# Patient Record
Sex: Male | Born: 1955 | Race: White | Hispanic: No | Marital: Married | Smoking: Current every day smoker
Health system: Southern US, Community
[De-identification: ages and names within clinical notes are randomized; demographics above are authoritative.]

## PROBLEM LIST (undated history)

## (undated) DIAGNOSIS — E7800 Pure hypercholesterolemia, unspecified: Secondary | ICD-10-CM

## (undated) DIAGNOSIS — I00 Rheumatic fever without heart involvement: Secondary | ICD-10-CM

## (undated) DIAGNOSIS — E785 Hyperlipidemia, unspecified: Secondary | ICD-10-CM

## (undated) DIAGNOSIS — T7840XA Allergy, unspecified, initial encounter: Secondary | ICD-10-CM

## (undated) DIAGNOSIS — K219 Gastro-esophageal reflux disease without esophagitis: Secondary | ICD-10-CM

## (undated) DIAGNOSIS — F17200 Nicotine dependence, unspecified, uncomplicated: Secondary | ICD-10-CM

## (undated) DIAGNOSIS — E669 Obesity, unspecified: Secondary | ICD-10-CM

## (undated) DIAGNOSIS — M1990 Unspecified osteoarthritis, unspecified site: Secondary | ICD-10-CM

## (undated) DIAGNOSIS — E119 Type 2 diabetes mellitus without complications: Secondary | ICD-10-CM

## (undated) HISTORY — DX: Rheumatic fever without heart involvement: I00

## (undated) HISTORY — DX: Gastro-esophageal reflux disease without esophagitis: K21.9

## (undated) HISTORY — DX: Pure hypercholesterolemia, unspecified: E78.00

## (undated) HISTORY — DX: Unspecified osteoarthritis, unspecified site: M19.90

## (undated) HISTORY — DX: Hyperlipidemia, unspecified: E78.5

## (undated) HISTORY — DX: Allergy, unspecified, initial encounter: T78.40XA

## (undated) HISTORY — DX: Obesity, unspecified: E66.9

## (undated) HISTORY — DX: Type 2 diabetes mellitus without complications: E11.9

## (undated) HISTORY — DX: Nicotine dependence, unspecified, uncomplicated: F17.200

---

## 1987-08-14 HISTORY — PX: BACK SURGERY: SHX140

## 2007-02-13 ENCOUNTER — Encounter: Payer: Self-pay | Admitting: Internal Medicine

## 2007-02-13 ENCOUNTER — Ambulatory Visit: Payer: Self-pay | Admitting: Internal Medicine

## 2007-02-13 DIAGNOSIS — K219 Gastro-esophageal reflux disease without esophagitis: Secondary | ICD-10-CM

## 2007-02-13 DIAGNOSIS — F17200 Nicotine dependence, unspecified, uncomplicated: Secondary | ICD-10-CM

## 2007-02-13 DIAGNOSIS — E669 Obesity, unspecified: Secondary | ICD-10-CM

## 2007-02-13 HISTORY — DX: Obesity, unspecified: E66.9

## 2007-02-13 HISTORY — DX: Nicotine dependence, unspecified, uncomplicated: F17.200

## 2007-02-13 HISTORY — DX: Gastro-esophageal reflux disease without esophagitis: K21.9

## 2007-02-13 LAB — CONVERTED CEMR LAB
Alkaline Phosphatase: 71 units/L (ref 39–117)
BUN: 12 mg/dL (ref 6–23)
Basophils Relative: 0 % (ref 0.0–1.0)
Bilirubin, Direct: 0.1 mg/dL (ref 0.0–0.3)
CO2: 24 meq/L (ref 19–32)
Cholesterol: 247 mg/dL (ref 0–200)
Direct LDL: 162.1 mg/dL
GFR calc Af Amer: 101 mL/min
Glucose, Bld: 150 mg/dL — ABNORMAL HIGH (ref 70–99)
HCT: 45.7 % (ref 39.0–52.0)
Hemoglobin: 15.4 g/dL (ref 13.0–17.0)
Hgb A1c MFr Bld: 6.5 % — ABNORMAL HIGH (ref 4.6–6.0)
Lymphocytes Relative: 28.4 % (ref 12.0–46.0)
Monocytes Absolute: 0.3 10*3/uL (ref 0.2–0.7)
Monocytes Relative: 5.7 % (ref 3.0–11.0)
Neutro Abs: 2.8 10*3/uL (ref 1.4–7.7)
Neutrophils Relative %: 62.4 % (ref 43.0–77.0)
PSA: 1.88 ng/mL (ref 0.10–4.00)
Potassium: 4.3 meq/L (ref 3.5–5.1)
Sodium: 138 meq/L (ref 135–145)
Total Bilirubin: 0.8 mg/dL (ref 0.3–1.2)
Total Protein: 7.1 g/dL (ref 6.0–8.3)
VLDL: 64 mg/dL — ABNORMAL HIGH (ref 0–40)

## 2007-03-19 ENCOUNTER — Ambulatory Visit: Payer: Self-pay | Admitting: Internal Medicine

## 2007-03-19 DIAGNOSIS — E7800 Pure hypercholesterolemia, unspecified: Secondary | ICD-10-CM

## 2007-03-19 DIAGNOSIS — E119 Type 2 diabetes mellitus without complications: Secondary | ICD-10-CM

## 2007-03-19 HISTORY — DX: Pure hypercholesterolemia, unspecified: E78.00

## 2007-03-19 HISTORY — DX: Type 2 diabetes mellitus without complications: E11.9

## 2010-12-08 ENCOUNTER — Encounter: Payer: Self-pay | Admitting: Internal Medicine

## 2010-12-08 ENCOUNTER — Ambulatory Visit (INDEPENDENT_AMBULATORY_CARE_PROVIDER_SITE_OTHER): Payer: BC Managed Care – PPO | Admitting: Internal Medicine

## 2010-12-08 ENCOUNTER — Ambulatory Visit (INDEPENDENT_AMBULATORY_CARE_PROVIDER_SITE_OTHER)
Admission: RE | Admit: 2010-12-08 | Discharge: 2010-12-08 | Disposition: A | Discharge status: Home / Self Care | Payer: BC Managed Care – PPO | Source: Ambulatory Visit | Attending: Internal Medicine | Admitting: Internal Medicine

## 2010-12-08 DIAGNOSIS — E785 Hyperlipidemia, unspecified: Secondary | ICD-10-CM

## 2010-12-08 DIAGNOSIS — Z87898 Personal history of other specified conditions: Secondary | ICD-10-CM

## 2010-12-08 DIAGNOSIS — E7800 Pure hypercholesterolemia, unspecified: Secondary | ICD-10-CM

## 2010-12-08 DIAGNOSIS — Z8709 Personal history of other diseases of the respiratory system: Secondary | ICD-10-CM

## 2010-12-08 DIAGNOSIS — F17200 Nicotine dependence, unspecified, uncomplicated: Secondary | ICD-10-CM

## 2010-12-08 DIAGNOSIS — G4730 Sleep apnea, unspecified: Secondary | ICD-10-CM

## 2010-12-08 DIAGNOSIS — Z23 Encounter for immunization: Secondary | ICD-10-CM

## 2010-12-08 DIAGNOSIS — Z0000 Encounter for general adult medical examination without abnormal findings: Secondary | ICD-10-CM

## 2010-12-08 DIAGNOSIS — E119 Type 2 diabetes mellitus without complications: Secondary | ICD-10-CM

## 2010-12-08 LAB — BASIC METABOLIC PANEL
BUN: 17 mg/dL (ref 6–23)
CO2: 25 mEq/L (ref 19–32)
Calcium: 8.9 mg/dL (ref 8.4–10.5)
Creatinine, Ser: 1.1 mg/dL (ref 0.4–1.5)
GFR: 74.5 mL/min (ref >60.00)
Glucose, Bld: 105 mg/dL — ABNORMAL HIGH (ref 70–99)

## 2010-12-08 LAB — CBC WITH DIFFERENTIAL/PLATELET
Basophils Relative: 0.4 % (ref 0.0–3.0)
Eosinophils Relative: 3.6 % (ref 0.0–5.0)
Lymphocytes Relative: 28.5 % (ref 12.0–46.0)
Lymphs Abs: 1.6 10*3/uL (ref 0.7–4.0)
MCHC: 34.6 g/dL (ref 30.0–36.0)
Monocytes Absolute: 0.3 10*3/uL (ref 0.1–1.0)
Monocytes Relative: 5.6 % (ref 3.0–12.0)
Platelets: 209 10*3/uL (ref 150.0–400.0)
RDW: 13 % (ref 11.5–14.6)

## 2010-12-08 LAB — HEPATIC FUNCTION PANEL
ALT: 22 U/L (ref 0–53)
Bilirubin, Direct: 0.1 mg/dL (ref 0.0–0.3)
Total Protein: 6.7 g/dL (ref 6.0–8.3)

## 2010-12-08 LAB — PSA: PSA: 1.02 ng/mL (ref 0.10–4.00)

## 2010-12-08 LAB — LIPID PANEL: Triglycerides: 162 mg/dL — ABNORMAL HIGH (ref 0.0–149.0)

## 2010-12-08 NOTE — Progress Notes (Signed)
Addended by: Duard Brady on: 12/08/2010 11:32 AM   Modules accepted: Orders

## 2010-12-08 NOTE — Patient Instructions (Signed)
It is important that you exercise regularly, at least 20 minutes 3 to 4 times per week.  If you develop chest pain or shortness of breath seek  medical attention.  You need to lose weight.  Consider a lower calorie diet and regular exercise.  Return in 6 months for follow-up colonoscopy  Sleep study   Chest X Ray

## 2010-12-08 NOTE — Progress Notes (Signed)
Subjective:    Patient ID: Barry Sims, male    DOB: 03/10/1956, 55 y.o.   MRN: [id number]  HPI  is a 55 year old patient who is seen today for followup and to reassess this with our office. He has a history of impaired glucose tolerance and exogenous obesity. He has dyslipidemia and a history of gastroesophageal reflux disease. He has a family history of coronary artery disease and ongoing tobacco use. 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 He is a heavy snorer but denies any daytime sleepiness clinical exam does reveal a low hanging uvula with significant pharyngeal crowding    Review of Systems  Constitutional: Negative for fever, chills, activity change, appetite change and fatigue.  HENT: Negative for hearing loss, ear pain, congestion, rhinorrhea, sneezing, mouth sores, trouble swallowing, neck pain, neck stiffness, dental problem, voice change, sinus pressure and tinnitus.   Eyes: Negative for photophobia, pain, redness and visual disturbance.  Respiratory: Negative for apnea, cough, choking, chest tightness, shortness of breath and wheezing.   Cardiovascular: Negative for chest pain, palpitations and leg swelling.  Gastrointestinal: Negative for nausea, vomiting, abdominal pain, diarrhea, constipation, blood in stool, abdominal distention, anal bleeding and rectal pain.  Genitourinary: Negative for dysuria, urgency, frequency, hematuria, flank pain, decreased urine volume, discharge, penile swelling, scrotal swelling, difficulty urinating, genital sores and testicular pain.  Musculoskeletal: Negative for myalgias, back pain, joint swelling, arthralgias and gait problem.  Skin: Negative for color change, rash and wound.    Neurological: Negative for dizziness, tremors, seizures, syncope, facial asymmetry, speech difficulty, weakness, light-headedness, numbness and headaches.  Hematological: Negative for adenopathy. Does not bruise/bleed easily.  Psychiatric/Behavioral: Negative for suicidal ideas, hallucinations, behavioral problems, confusion, sleep disturbance, self-injury, dysphoric mood, decreased concentration and agitation. The patient is not nervous/anxious.        Objective:   Physical Exam  Constitutional: He appears well-developed and well-nourished.       Moderate obesity with a weight of 247; blood pressure 120/82  HENT:  Head: Normocephalic and atraumatic.  Right Ear: External ear normal.  Left Ear: External ear normal.  Nose: Nose normal.  Mouth/Throat: Oropharynx is clear and moist.       Low hanging uvula with significant pharyngeal crowding  Eyes: Conjunctivae and EOM are normal. Pupils are equal, round, and reactive to light. No scleral icterus.  Neck: Normal range of motion. Neck supple. No JVD present. No thyromegaly present.  Cardiovascular: Regular rhythm, normal heart sounds and intact distal pulses.  Exam reveals no gallop and no friction rub.   No murmur heard. Pulmonary/Chest: Effort normal and breath sounds normal. He exhibits no tenderness.  Abdominal: Soft. Bowel sounds are normal. He exhibits no distension and no mass. There is no tenderness.  Genitourinary: Prostate normal.  Musculoskeletal: Normal range of motion. He exhibits edema. He exhibits no tenderness.       Trace pedal edema  Lymphadenopathy:    He has no cervical adenopathy.  Neurological: He is alert. He has normal reflexes. No cranial nerve deficit. Coordination normal.  Skin: Skin is warm and dry. No rash noted.  Psychiatric: He has a normal mood and affect. His behavior is normal.          Assessment &  Plan:   Exogenous obesity Ongoing tobacco use Family history coronary artery disease OSA  suspect  History of impaired glucose tolerance history of dyslipidemia  screening colonoscopy Consider sleep study

## 2011-01-26 ENCOUNTER — Telehealth: Payer: Self-pay | Admitting: Internal Medicine

## 2011-01-26 NOTE — Telephone Encounter (Signed)
Will discuss at next office visit

## 2011-01-26 NOTE — Telephone Encounter (Signed)
Attempt to call - number listed with note - no and no mach, called hm# - ans mach -left msg about labs and sleep study. KIK

## 2011-01-26 NOTE — Telephone Encounter (Signed)
I don't see an order for sleep study - it was being considered at time of rov - please advise. kik

## 2011-01-26 NOTE — Telephone Encounter (Signed)
Pt called and said that Dr Amador Cunas had mentioned pt getting a sleep study done back in April. Pt is wondering status of getting an order to have this done? Pt req to get lab results.

## 2011-01-29 ENCOUNTER — Telehealth: Payer: Self-pay | Admitting: *Deleted

## 2011-01-29 NOTE — Telephone Encounter (Signed)
Pt needs form/letter for employer stating that Dr. Kirtland Bouchard did not recommend him to have a sleep study at this time.

## 2011-01-29 NOTE — Telephone Encounter (Signed)
Patient is at risk for OSA and would recommend sleep study at his convenience

## 2011-02-01 ENCOUNTER — Encounter: Payer: Self-pay | Admitting: Gastroenterology

## 2011-02-01 ENCOUNTER — Telehealth: Payer: Self-pay | Admitting: *Deleted

## 2011-02-01 NOTE — Telephone Encounter (Signed)
Attempt to call pt - ans mach - left message about what he really needs - have other phone msg wanting letter for employer - now needing dot form - also dr. Levie Heritage has ok'd sleep study if needed because waiting would be an issus with work. KIK

## 2011-02-01 NOTE — Telephone Encounter (Signed)
Pt calling to check status of DOT form that was to be completed by Dr. Kirtland Bouchard

## 2011-02-01 NOTE — Telephone Encounter (Signed)
Attempt to call at number left and hm# - ans mach - left msg that I need to know what he needs - dot form? Letter for employer? Also dr. Amador Cunas ok sleep study to be order if need because it is cause issus at work. Please call and let me know what is needed. KIK

## 2011-02-08 ENCOUNTER — Telehealth: Payer: Self-pay | Admitting: Internal Medicine

## 2011-02-08 NOTE — Telephone Encounter (Signed)
Pt called to check on status of [employer] form, that the pt brought by office on 02/06/11. 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

## 2011-02-09 NOTE — Telephone Encounter (Signed)
Spoke with pt about form - needs to be filled out as soon as possible. I asked about sleep study - he states he doesn't feel that it is needed at this time , no problems sleeping , and had to plan to  Discuss at next visit as indicated in previous conversations with [place].  Form placed on dr. Vernon Prey desk for completion.  Told pt I will call when form done. KIK

## 2011-02-09 NOTE — Telephone Encounter (Signed)
Spoke with pt - letter ready for pick up KIK

## 2014-03-25 ENCOUNTER — Telehealth: Payer: Self-pay | Admitting: Internal Medicine

## 2014-03-25 NOTE — Telephone Encounter (Signed)
Pt has been sch

## 2014-03-25 NOTE — Telephone Encounter (Signed)
Yes, you can schedule CPX.

## 2014-03-25 NOTE — Telephone Encounter (Signed)
Pt was last seen 12/08/2010. Pt would like to re-est and sch cpx. Can I sch?

## 2014-04-08 ENCOUNTER — Ambulatory Visit (INDEPENDENT_AMBULATORY_CARE_PROVIDER_SITE_OTHER): Payer: BC Managed Care – PPO | Admitting: Internal Medicine

## 2014-04-08 ENCOUNTER — Encounter: Payer: Self-pay | Admitting: Internal Medicine

## 2014-04-08 VITALS — BP 114/74 | HR 73 | Temp 98.9°F | Resp 20 | Ht 68.75 in | Wt 247.0 lb

## 2014-04-08 DIAGNOSIS — E119 Type 2 diabetes mellitus without complications: Secondary | ICD-10-CM

## 2014-04-08 DIAGNOSIS — E669 Obesity, unspecified: Secondary | ICD-10-CM

## 2014-04-08 DIAGNOSIS — Z0000 Encounter for general adult medical examination without abnormal findings: Secondary | ICD-10-CM

## 2014-04-08 DIAGNOSIS — E7800 Pure hypercholesterolemia, unspecified: Secondary | ICD-10-CM

## 2014-04-08 DIAGNOSIS — F17200 Nicotine dependence, unspecified, uncomplicated: Secondary | ICD-10-CM

## 2014-04-08 LAB — HEMOGLOBIN A1C: Hgb A1c MFr Bld: 7.4 % — ABNORMAL HIGH (ref 4.6–6.5)

## 2014-04-08 LAB — PSA: PSA: 0.85 ng/mL (ref 0.10–4.00)

## 2014-04-08 NOTE — Progress Notes (Signed)
Subjective:    Patient ID: Barry Sims, male    DOB: [birth year], 58 y.o.   MRN: [id number]  HPI  58 year old patient who is seen today for an annual exam.  He has not been seen here in about 3 years.. He has a history of impaired glucose tolerance and exogenous obesity. He has dyslipidemia and a history of gastroesophageal reflux disease. He has a family history of coronary artery disease and ongoing tobacco use. He is asymptomatic today.  He states that he has had a recent laboratory panel at work and a random blood sugar was 160.  Hemoglobin A1c is to have been in a nondiabetic range, but have not been checked in about 3 years.  He also states that he was told he had an elevated PSA of 10 in the past.  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 He is a heavy snorer but denies any daytime sleepiness clinical exam does reveal a low hanging uvula with significant pharyngeal crowding.  A sleep study was set up in the past, but never performed.  He denies any daytime sleepiness at present, and states he drives very little.  Past Medical History:  patient had back surgery in 1989 for herniated disk. Otherwise no hospital admissions   Family History:  Father died secondary to liver cancer. Mother died at 31 of single dementia of the Alzheimer's type. Two brothers, one with the CAD disease, status post multiple stenting one sister with COPD  Risk Factors:  Tobacco use: current  Counseled to quit/cut down tobacco use: yes    Past Medical History  Diagnosis Date  . Allergy   . Arthritis   . GERD (gastroesophageal reflux disease)   . Rheumatic fever   . Hyperlipidemia   . Diabetes mellitus   . Obesity   . TOBACCO  USE 02/13/2007  . OBESITY 02/13/2007  . HYPERCHOLESTEROLEMIA 03/19/2007  . G E R D 02/13/2007  . AODM 03/19/2007    History   Social History  . Marital Status: Married    Spouse Name: N/A    Number of Children: N/A  . Years of Education: N/A   Occupational History  . Not on file.   Social History Main Topics  . Smoking status: Current Every Day Smoker -- 1.00 packs/day    Types: Cigarettes  . Smokeless tobacco: Never Used  . Alcohol Use: No  . Drug Use: No  . Sexual Activity: Not on file   Other Topics Concern  . Not on file   Social History Narrative  . No narrative on file    Past Surgical History  Procedure Laterality Date  . Back surgery  1989    Family History  Problem Relation Age of Onset  . Cancer Father   . COPD Sister   . Heart disease Brother   . Prostate cancer Brother     Allergies  Allergen Reactions  . Penicillins     No current outpatient prescriptions on file prior to visit.   No current facility-administered medications on file prior to visit.    BP 114/74  Pulse 73  Temp(Src) 98.9 F (37.2 C) (Oral)  Resp 20  Ht 5' 8.75" (  1.746 m)  Wt 247 lb (112.038 kg)  BMI 36.75 kg/m2  SpO2 98%       Review of Systems  Constitutional: Negative for fever, chills, activity change, appetite change and fatigue.  HENT: Negative for congestion, dental problem, ear pain, hearing loss, mouth sores, rhinorrhea, sinus pressure, sneezing, tinnitus, trouble swallowing and voice change.   Eyes: Negative for photophobia, pain, redness and visual disturbance.  Respiratory: Negative for apnea, cough, choking, chest tightness, shortness of breath and wheezing.   Cardiovascular: Negative for chest pain, palpitations and leg swelling.  Gastrointestinal: Negative for nausea, vomiting, abdominal pain, diarrhea, constipation, blood in stool, abdominal distention, anal bleeding and rectal pain.  Genitourinary: Negative for dysuria, urgency, frequency, hematuria,  flank pain, decreased urine volume, discharge, penile swelling, scrotal swelling, difficulty urinating, genital sores and testicular pain.  Musculoskeletal: Negative for arthralgias, back pain, gait problem, joint swelling, myalgias, neck pain and neck stiffness.  Skin: Negative for color change, rash and wound.  Neurological: Negative for dizziness, tremors, seizures, syncope, facial asymmetry, speech difficulty, weakness, light-headedness, numbness and headaches.  Hematological: Negative for adenopathy. Does not bruise/bleed easily.  Psychiatric/Behavioral: Negative for suicidal ideas, hallucinations, behavioral problems, confusion, sleep disturbance, self-injury, dysphoric mood, decreased concentration and agitation. The patient is not nervous/anxious.        Objective:   Physical Exam  Constitutional: He appears well-developed and well-nourished.  Moderate obesity with a weight of 247; blood pressure 120/82  HENT:  Head: Normocephalic and atraumatic.  Right Ear: External ear normal.  Left Ear: External ear normal.  Nose: Nose normal.  Mouth/Throat: Oropharynx is clear and moist.  Low hanging uvula with significant pharyngeal crowding  Eyes: Conjunctivae and EOM are normal. Pupils are equal, round, and reactive to light. No scleral icterus.  Strabismus noted  Neck: Normal range of motion. Neck supple. No JVD present. No thyromegaly present.  Cardiovascular: Regular rhythm, normal heart sounds and intact distal pulses.  Exam reveals no gallop and no friction rub.   No murmur heard. Pulmonary/Chest: Effort normal and breath sounds normal. He exhibits no tenderness.  Abdominal: Soft. Bowel sounds are normal. He exhibits no distension and no mass. There is no tenderness.  Genitourinary: Prostate normal. Guaiac negative stool.  Musculoskeletal: Normal range of motion. He exhibits edema. He exhibits no tenderness.  Trace pedal edema  Lymphadenopathy:    He has no cervical adenopathy.   Neurological: He is alert. He has normal reflexes. No cranial nerve deficit. Coordination normal.  Skin: Skin is warm and dry. No rash noted.  Psychiatric: He has a normal mood and affect. His behavior is normal.          Assessment & Plan:   Exogenous obesity Ongoing tobacco use Family history coronary artery disease OSA suspect  History of impaired glucose tolerance.  We'll check a hemoglobin A1c history of dyslipidemia History of elevated PSA.  We'll recheck  screening colonoscopy Consider sleep study

## 2014-04-08 NOTE — Progress Notes (Signed)
Pre visit review using our clinic review tool, if applicable. No additional management support is needed unless otherwise documented below in the visit note. 

## 2014-04-08 NOTE — Progress Notes (Signed)
   Subjective:    Patient ID: Barry Sims, male    DOB: April 08, 1956, 58 y.o.   MRN: [id number]  HPI  Wt Readings from Last 3 Encounters:  04/08/14 247 lb (112.038 kg)  12/08/10 247 lb (112.038 kg)  03/19/07 270 lb (122.471 kg)    Review of Systems     Objective:   Physical Exam        Assessment & Plan:

## 2014-04-08 NOTE — Patient Instructions (Signed)
Limit your sodium (Salt) intake    It is important that you exercise regularly, at least 20 minutes 3 to 4 times per week.  If you develop chest pain or shortness of breath seek  medical attention.  You need to lose weight.  Consider a lower calorie diet and regular exercise.Health Maintenance A healthy lifestyle and preventative care can promote health and wellness.  Maintain regular health, dental, and eye exams.  Eat a healthy diet. Foods like vegetables, fruits, whole grains, low-fat dairy products, and lean protein foods contain the nutrients you need and are low in calories. Decrease your intake of foods high in solid fats, added sugars, and salt. Get information about a proper diet from your health care provider, if necessary.  Regular physical exercise is one of the most important things you can do for your health. Most adults should get at least 150 minutes of moderate-intensity exercise (any activity that increases your heart rate and causes you to sweat) each week. In addition, most adults need muscle-strengthening exercises on 2 or more days a week.   Maintain a healthy weight. The body mass index (BMI) is a screening tool to identify possible weight problems. It provides an estimate of body fat based on height and weight. Your health care provider can find your BMI and can help you achieve or maintain a healthy weight. For males 20 years and older:  A BMI below 18.5 is considered underweight.  A BMI of 18.5 to 24.9 is normal.  A BMI of 25 to 29.9 is considered overweight.  A BMI of 30 and above is considered obese.  Maintain normal blood lipids and cholesterol by exercising and minimizing your intake of saturated fat. Eat a balanced diet with plenty of fruits and vegetables. Blood tests for lipids and cholesterol should begin at age 20 and be repeated every 5 years. If your lipid or cholesterol levels are high, you are over age 50, or you are at high risk for heart disease, you  may need your cholesterol levels checked more frequently.Ongoing high lipid and cholesterol levels should be treated with medicines if diet and exercise are not working.  If you smoke, find out from your health care provider how to quit. If you do not use tobacco, do not start.  Lung cancer screening is recommended for adults aged 55-80 years who are at high risk for developing lung cancer because of a history of smoking. A yearly low-dose CT scan of the lungs is recommended for people who have at least a 30-pack-year history of smoking and are current smokers or have quit within the past 15 years. A pack year of smoking is smoking an average of 1 pack of cigarettes a day for 1 year (for example, a 30-pack-year history of smoking could mean smoking 1 pack a day for 30 years or 2 packs a day for 15 years). Yearly screening should continue until the smoker has stopped smoking for at least 15 years. Yearly screening should be stopped for people who develop a health problem that would prevent them from having lung cancer treatment.  If you choose to drink alcohol, do not have more than 2 drinks per day. One drink is considered to be 12 oz (360 mL) of beer, 5 oz (150 mL) of wine, or 1.5 oz (45 mL) of liquor.  Avoid the use of street drugs. Do not share needles with anyone. Ask for help if you need support or instructions about stopping the use of drugs.    High blood pressure causes heart disease and increases the risk of stroke. Blood pressure should be checked at least every 1-2 years. Ongoing high blood pressure should be treated with medicines if weight loss and exercise are not effective.  If you are 45-79 years old, ask your health care provider if you should take aspirin to prevent heart disease.  Diabetes screening involves taking a blood sample to check your fasting blood sugar level. This should be done once every 3 years after age 45 if you are at a normal weight and without risk factors for  diabetes. Testing should be considered at a younger age or be carried out more frequently if you are overweight and have at least 1 risk factor for diabetes.  Colorectal cancer can be detected and often prevented. Most routine colorectal cancer screening begins at the age of 50 and continues through age 75. However, your health care provider may recommend screening at an earlier age if you have risk factors for colon cancer. On a yearly basis, your health care provider may provide home test kits to check for hidden blood in the stool. A small camera at the end of a tube may be used to directly examine the colon (sigmoidoscopy or colonoscopy) to detect the earliest forms of colorectal cancer. Talk to your health care provider about this at age 50 when routine screening begins. A direct exam of the colon should be repeated every 5-10 years through age 75, unless early forms of precancerous polyps or small growths are found.  People who are at an increased risk for hepatitis B should be screened for this virus. You are considered at high risk for hepatitis B if:  You were born in a country where hepatitis B occurs often. Talk with your health care provider about which countries are considered high risk.  Your parents were born in a high-risk country and you have not received a shot to protect against hepatitis B (hepatitis B vaccine).  You have HIV or AIDS.  You use needles to inject street drugs.  You live with, or have sex with, someone who has hepatitis B.  You are a man who has sex with other men (MSM).  You get hemodialysis treatment.  You take certain medicines for conditions like cancer, organ transplantation, and autoimmune conditions.  Hepatitis C blood testing is recommended for all people born from 1945 through 1965 and any individual with known risk factors for hepatitis C.  Healthy men should no longer receive prostate-specific antigen (PSA) blood tests as part of routine cancer  screening. Talk to your health care provider about prostate cancer screening.  Testicular cancer screening is not recommended for adolescents or adult males who have no symptoms. Screening includes self-exam, a health care provider exam, and other screening tests. Consult with your health care provider about any symptoms you have or any concerns you have about testicular cancer.  Practice safe sex. Use condoms and avoid high-risk sexual practices to reduce the spread of sexually transmitted infections (STIs).  You should be screened for STIs, including gonorrhea and chlamydia if:  You are sexually active and are younger than 24 years.  You are older than 24 years, and your health care provider tells you that you are at risk for this type of infection.  Your sexual activity has changed since you were last screened, and you are at an increased risk for chlamydia or gonorrhea. Ask your health care provider if you are at risk.  If you are   at risk of being infected with HIV, it is recommended that you take a prescription medicine daily to prevent HIV infection. This is called pre-exposure prophylaxis (PrEP). You are considered at risk if:  You are a man who has sex with other men (MSM).  You are a heterosexual man who is sexually active with multiple partners.  You take drugs by injection.  You are sexually active with a partner who has HIV.  Talk with your health care provider about whether you are at high risk of being infected with HIV. If you choose to begin PrEP, you should first be tested for HIV. You should then be tested every 3 months for as long as you are taking PrEP.  Use sunscreen. Apply sunscreen liberally and repeatedly throughout the day. You should seek shade when your shadow is shorter than you. Protect yourself by wearing long sleeves, pants, a wide-brimmed hat, and sunglasses year round whenever you are outdoors.  Tell your health care provider of new moles or changes in  moles, especially if there is a change in shape or color. Also, tell your health care provider if a mole is larger than the size of a pencil eraser.  A one-time screening for abdominal aortic aneurysm (AAA) and surgical repair of large AAAs by ultrasound is recommended for men aged 65-75 years who are current or former smokers.  Stay current with your vaccines (immunizations). Document Released: 01/26/2008 Document Revised: 08/04/2013 Document Reviewed: 12/25/2010 [employer] Patient Information 2015 [employer]. This information is not intended to replace advice given to you by your health care provider. Make sure you discuss any questions you have with your health care provider. Cardiac Diet This diet can help prevent heart disease and stroke. Many factors influence your heart health, including eating and exercise habits. Coronary risk rises a lot with abnormal blood fat (lipid) levels. Cardiac meal planning includes limiting unhealthy fats, increasing healthy fats, and making other small dietary changes. General guidelines are as follows:  Adjust calorie intake to reach and maintain desirable body weight.  Limit total fat intake to less than 30% of total calories. Saturated fat should be less than 7% of calories.  Saturated fats are found in animal products and in some vegetable products. Saturated vegetable fats are found in coconut oil, cocoa butter, palm oil, and palm kernel oil. Read labels carefully to avoid these products as much as possible. Use butter in moderation. Choose tub margarines and oils that have 2 grams of fat or less. Good cooking oils are canola and olive oils.  Practice low-fat cooking techniques. Do not fry food. Instead, broil, bake, boil, steam, grill, roast on a rack, stir-fry, or microwave it. Other fat reducing suggestions include:  Remove the skin from poultry.  Remove all visible fat from meats.  Skim the fat off stews, soups, and gravies before serving  them.  Steam vegetables in water or broth instead of sauting them in fat.  Avoid foods with trans fat (or hydrogenated oils), such as commercially fried foods and commercially baked goods. Commercial shortening and deep-frying fats will contain trans fat.  Increase intake of fruits, vegetables, whole grains, and legumes to replace foods high in fat.  Increase consumption of nuts, legumes, and seeds to at least 4 servings weekly. One serving of a legume equals  cup, and 1 serving of nuts or seeds equals  cup.  Choose whole grains more often. Have 3 servings per day (a serving is 1 ounce [oz]).  Eat 4 to   5 servings of vegetables per day. A serving of vegetables is 1 cup of raw leafy vegetables;  cup of raw or cooked cut-up vegetables;  cup of vegetable juice.  Eat 4 to 5 servings of fruit per day. A serving of fruit is 1 medium whole fruit;  cup of dried fruit;  cup of fresh, frozen, or canned fruit;  cup of 100% fruit juice.  Increase your intake of dietary fiber to 20 to 30 grams per day. Insoluble fiber may help lower your risk of heart disease and may help curb your appetite.  Soluble fiber binds cholesterol to be removed from the blood. Foods high in soluble fiber are dried beans, citrus fruits, oats, apples, bananas, broccoli, Brussels sprouts, and eggplant.  Try to include foods fortified with plant sterols or stanols, such as yogurt, breads, juices, or margarines. Choose several fortified foods to achieve a daily intake of 2 to 3 grams of plant sterols or stanols.  Foods with omega-3 fats can help reduce your risk of heart disease. Aim to have a 3.5 oz portion of fatty fish twice per week, such as salmon, mackerel, albacore tuna, sardines, lake trout, or herring. If you wish to take a fish oil supplement, choose one that contains 1 gram of both DHA and EPA.  Limit processed meats to 2 servings (3 oz portion) weekly.  Limit the sodium in your diet to 1500 milligrams (mg) per  day. If you have high blood pressure, talk to a registered dietitian about a DASH (Dietary Approaches to Stop Hypertension) eating plan.  Limit sweets and beverages with added sugar, such as soda, to no more than 5 servings per week. One serving is:   1 tablespoon sugar.  1 tablespoon jelly or jam.   cup sorbet.  1 cup lemonade.   cup regular soda. CHOOSING FOODS Starches  Allowed: Breads: All kinds (wheat, rye, raisin, white, oatmeal, Italian, French, and English muffin bread). Low-fat rolls: English muffins, frankfurter and hamburger buns, bagels, pita bread, tortillas (not fried). Pancakes, waffles, biscuits, and muffins made with recommended oil.  Avoid: Products made with saturated or trans fats, oils, or whole milk products. Butter rolls, cheese breads, croissants. Commercial doughnuts, muffins, sweet rolls, biscuits, waffles, pancakes, store-bought mixes. Crackers  Allowed: Low-fat crackers and snacks: Animal, graham, rye, saltine (with recommended oil, no lard), oyster, and matzo crackers. Bread sticks, melba toast, rusks, flatbread, pretzels, and light popcorn.  Avoid: High-fat crackers: cheese crackers, butter crackers, and those made with coconut, palm oil, or trans fat (hydrogenated oils). Buttered popcorn. Cereals  Allowed: Hot or cold whole-grain cereals.  Avoid: Cereals containing coconut, hydrogenated vegetable fat, or animal fat. Potatoes / Pasta / Rice  Allowed: All kinds of potatoes, rice, and pasta (such as macaroni, spaghetti, and noodles).  Avoid: Pasta or rice prepared with cream sauce or high-fat cheese. Chow mein noodles, French fries. Vegetables  Allowed: All vegetables and vegetable juices.  Avoid: Fried vegetables. Vegetables in cream, butter, or high-fat cheese sauces. Limit coconut. Fruit in cream or custard. Protein  Allowed: Limit your intake of meat, seafood, and poultry to no more than 6 oz (cooked weight) per day. All lean, well-trimmed  beef, veal, pork, and lamb. All chicken and turkey without skin. All fish and shellfish. Wild game: wild duck, rabbit, pheasant, and venison. Egg whites or low-cholesterol egg substitutes may be used as desired. Meatless dishes: recipes with dried beans, peas, lentils, and tofu (soybean curd). Seeds and nuts: all seeds and most nuts.  Avoid:   Prime grade and other heavily marbled and fatty meats, such as short ribs, spare ribs, rib eye roast or steak, frankfurters, sausage, bacon, and high-fat luncheon meats, mutton. Caviar. Commercially fried fish. Domestic duck, goose, venison sausage. Organ meats: liver, gizzard, heart, chitterlings, brains, kidney, sweetbreads. Dairy  Allowed: Low-fat cheeses: nonfat or low-fat cottage cheese (1% or 2% fat), cheeses made with part skim milk, such as mozzarella, farmers, string, or ricotta. (Cheeses should be labeled no more than 2 to 6 grams fat per oz.). Skim (or 1%) milk: liquid, powdered, or evaporated. Buttermilk made with low-fat milk. Drinks made with skim or low-fat milk or cocoa. Chocolate milk or cocoa made with skim or low-fat (1%) milk. Nonfat or low-fat yogurt.  Avoid: Whole milk cheeses, including colby, cheddar, muenster, Monterey Jack, Havarti, Brie, Camembert, American, Swiss, and blue. Creamed cottage cheese, cream cheese. Whole milk and whole milk products, including buttermilk or yogurt made from whole milk, drinks made from whole milk. Condensed milk, evaporated whole milk, and 2% milk. Soups and Combination Foods  Allowed: Low-fat low-sodium soups: broth, dehydrated soups, homemade broth, soups with the fat removed, homemade cream soups made with skim or low-fat milk. Low-fat spaghetti, lasagna, chili, and Spanish rice if low-fat ingredients and low-fat cooking techniques are used.  Avoid: Cream soups made with whole milk, cream, or high-fat cheese. All other soups. Desserts and Sweets  Allowed: Sherbet, fruit ices, gelatins, meringues, and  angel food cake. Homemade desserts with recommended fats, oils, and milk products. Jam, jelly, honey, marmalade, sugars, and syrups. Pure sugar candy, such as gum drops, hard candy, jelly beans, marshmallows, mints, and small amounts of dark chocolate.  Avoid: Commercially prepared cakes, pies, cookies, frosting, pudding, or mixes for these products. Desserts containing whole milk products, chocolate, coconut, lard, palm oil, or palm kernel oil. Ice cream or ice cream drinks. Candy that contains chocolate, coconut, butter, hydrogenated fat, or unknown ingredients. Buttered syrups. Fats and Oils  Allowed: Vegetable oils: safflower, sunflower, corn, soybean, cottonseed, sesame, canola, olive, or peanut. Non-hydrogenated margarines. Salad dressing or mayonnaise: homemade or commercial, made with a recommended oil. Low or nonfat salad dressing or mayonnaise.  Limit added fats and oils to 6 to 8 tsp per day (includes fats used in cooking, baking, salads, and spreads on bread). Remember to count the "hidden fats" in foods.  Avoid: Solid fats and shortenings: butter, lard, salt pork, bacon drippings. Gravy containing meat fat, shortening, or suet. Cocoa butter, coconut. Coconut oil, palm oil, palm kernel oil, or hydrogenated oils: these ingredients are often used in bakery products, nondairy creamers, whipped toppings, candy, and commercially fried foods. Read labels carefully. Salad dressings made of unknown oils, sour cream, or cheese, such as blue cheese and Roquefort. Cream, all kinds: half-and-half, light, heavy, or whipping. Sour cream or cream cheese (even if "light" or low-fat). Nondairy cream substitutes: coffee creamers and sour cream substitutes made with palm, palm kernel, hydrogenated oils, or coconut oil. Beverages  Allowed: Coffee (regular or decaffeinated), tea. Diet carbonated beverages, mineral water. Alcohol: Check with your caregiver. Moderation is recommended.  Avoid: Whole milk, regular  sodas, and juice drinks with added sugar. Condiments  Allowed: All seasonings and condiments. Cocoa powder. "Cream" sauces made with recommended ingredients.  Avoid: Carob powder made with hydrogenated fats. SAMPLE MENU Breakfast   cup orange juice   cup oatmeal  1 slice toast  1 tsp margarine  1 cup skim milk Lunch  Turkey sandwich with 2 oz turkey, 2 slices bread  Lettuce   and tomato slices  Fresh fruit  Carrot sticks  Coffee or tea Snack  Fresh fruit or low-fat crackers Dinner  3 oz lean ground beef  1 baked potato  1 tsp margarine   cup asparagus  Lettuce salad  1 tbs non-creamy dressing   cup peach slices  1 cup skim milk Document Released: 05/08/2008 Document Revised: 01/29/2012 Document Reviewed: 09/29/2013 [employer] Patient Information 2015 [employer]. This information is not intended to replace advice given to you by your health care provider. Make sure you discuss any questions you have with your health care provider.  

## 2014-04-09 ENCOUNTER — Other Ambulatory Visit: Payer: Self-pay | Admitting: *Deleted

## 2014-04-09 MED ORDER — METFORMIN HCL 500 MG PO TABS: 500.0000 mg | ORAL_TABLET | Freq: Two times a day (BID) | ORAL | Status: DC

## 2014-05-06 ENCOUNTER — Encounter: Payer: BC Managed Care – PPO | Admitting: Internal Medicine

## 2014-05-14 ENCOUNTER — Encounter: Payer: Self-pay | Admitting: Internal Medicine

## 2014-10-03 ENCOUNTER — Other Ambulatory Visit: Payer: Self-pay | Admitting: Internal Medicine

## 2014-11-18 ENCOUNTER — Telehealth: Payer: Self-pay | Admitting: Internal Medicine

## 2014-11-18 ENCOUNTER — Other Ambulatory Visit: Payer: Self-pay | Admitting: Family Medicine

## 2014-11-18 DIAGNOSIS — E119 Type 2 diabetes mellitus without complications: Secondary | ICD-10-CM

## 2014-11-18 NOTE — Telephone Encounter (Signed)
Yes, please schedule lab appt and remind pt he is due for physical in August. Will put order in EP[facility]. Dr.Hunter said okay to order Hemoglobin A1c. Order in EPIC.

## 2014-11-18 NOTE — Telephone Encounter (Signed)
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 Can this be done

## 2014-11-19 ENCOUNTER — Other Ambulatory Visit (INDEPENDENT_AMBULATORY_CARE_PROVIDER_SITE_OTHER): Payer: Self-pay

## 2014-11-19 DIAGNOSIS — E119 Type 2 diabetes mellitus without complications: Secondary | ICD-10-CM

## 2014-11-19 LAB — HEMOGLOBIN A1C: HEMOGLOBIN A1C: 6 % (ref 4.6–6.5)

## 2014-11-19 NOTE — Telephone Encounter (Signed)
lmovm to c/b and schedule lab for A1C

## 2014-11-19 NOTE — Telephone Encounter (Signed)
Pt has been sch for today at 315pm

## 2015-04-12 ENCOUNTER — Other Ambulatory Visit: Payer: Self-pay | Admitting: Internal Medicine

## 2015-04-29 ENCOUNTER — Ambulatory Visit
Admission: RE | Admit: 2015-04-29 | Discharge: 2015-04-29 | Disposition: A | Discharge status: Home / Self Care | Payer: Worker's Compensation | Source: Ambulatory Visit | Attending: Family Medicine | Admitting: Family Medicine

## 2015-04-29 ENCOUNTER — Other Ambulatory Visit: Payer: Self-pay | Admitting: Family Medicine

## 2015-04-29 DIAGNOSIS — T1490XA Injury, unspecified, initial encounter: Secondary | ICD-10-CM

## 2015-04-29 DIAGNOSIS — M5489 Other dorsalgia: Secondary | ICD-10-CM

## 2016-04-15 ENCOUNTER — Other Ambulatory Visit: Payer: Self-pay | Admitting: Internal Medicine

## 2016-04-19 ENCOUNTER — Other Ambulatory Visit: Payer: Self-pay | Admitting: Internal Medicine

## 2017-03-03 ENCOUNTER — Encounter (HOSPITAL_BASED_OUTPATIENT_CLINIC_OR_DEPARTMENT_OTHER): Payer: Self-pay | Admitting: Emergency Medicine

## 2017-03-03 ENCOUNTER — Emergency Department (HOSPITAL_BASED_OUTPATIENT_CLINIC_OR_DEPARTMENT_OTHER): Payer: BLUE CROSS/BLUE SHIELD

## 2017-03-03 ENCOUNTER — Emergency Department (HOSPITAL_BASED_OUTPATIENT_CLINIC_OR_DEPARTMENT_OTHER)
Admission: EM | Admit: 2017-03-03 | Discharge: 2017-03-03 | Disposition: A | Discharge status: Home / Self Care | Payer: BLUE CROSS/BLUE SHIELD | Attending: Emergency Medicine | Admitting: Emergency Medicine

## 2017-03-03 DIAGNOSIS — E119 Type 2 diabetes mellitus without complications: Secondary | ICD-10-CM | POA: Insufficient documentation

## 2017-03-03 DIAGNOSIS — F17210 Nicotine dependence, cigarettes, uncomplicated: Secondary | ICD-10-CM | POA: Insufficient documentation

## 2017-03-03 DIAGNOSIS — R1031 Right lower quadrant pain: Secondary | ICD-10-CM | POA: Diagnosis not present

## 2017-03-03 DIAGNOSIS — Z7984 Long term (current) use of oral hypoglycemic drugs: Secondary | ICD-10-CM | POA: Insufficient documentation

## 2017-03-03 LAB — CBC WITH DIFFERENTIAL/PLATELET
BASOS PCT: 0 %
Basophils Absolute: 0 10*3/uL (ref 0.0–0.1)
EOS ABS: 0.2 10*3/uL (ref 0.0–0.7)
EOS PCT: 3 %
HCT: 41.1 % (ref 39.0–52.0)
HEMOGLOBIN: 14.4 g/dL (ref 13.0–17.0)
Lymphocytes Relative: 20 %
Lymphs Abs: 1.3 10*3/uL (ref 0.7–4.0)
MCH: 32.6 pg (ref 26.0–34.0)
MCHC: 35 g/dL (ref 30.0–36.0)
MCV: 93 fL (ref 78.0–100.0)
Monocytes Absolute: 0.4 10*3/uL (ref 0.1–1.0)
Monocytes Relative: 7 %
NEUTROS PCT: 70 %
Neutro Abs: 4.3 10*3/uL (ref 1.7–7.7)
PLATELETS: 180 10*3/uL (ref 150–400)
RBC: 4.42 MIL/uL (ref 4.22–5.81)
RDW: 13 % (ref 11.5–15.5)
WBC: 6.2 10*3/uL (ref 4.0–10.5)

## 2017-03-03 LAB — COMPREHENSIVE METABOLIC PANEL
ALBUMIN: 3.8 g/dL (ref 3.5–5.0)
ALT: 15 U/L — AB (ref 17–63)
ANION GAP: 7 (ref 5–15)
AST: 18 U/L (ref 15–41)
Alkaline Phosphatase: 62 U/L (ref 38–126)
BUN: 13 mg/dL (ref 6–20)
CALCIUM: 8.7 mg/dL — AB (ref 8.9–10.3)
CHLORIDE: 107 mmol/L (ref 101–111)
CO2: 26 mmol/L (ref 22–32)
Creatinine, Ser: 1.2 mg/dL (ref 0.61–1.24)
GFR calc Af Amer: >60 mL/min (ref >60)
GFR calc non Af Amer: >60 mL/min (ref >60)
GLUCOSE: 114 mg/dL — AB (ref 65–99)
Potassium: 4 mmol/L (ref 3.5–5.1)
SODIUM: 140 mmol/L (ref 135–145)
Total Bilirubin: 0.7 mg/dL (ref 0.3–1.2)
Total Protein: 6.8 g/dL (ref 6.5–8.1)

## 2017-03-03 LAB — URINALYSIS, ROUTINE W REFLEX MICROSCOPIC
Bilirubin Urine: NEGATIVE
GLUCOSE, UA: NEGATIVE mg/dL
Hgb urine dipstick: NEGATIVE
Ketones, ur: NEGATIVE mg/dL
Leukocytes, UA: NEGATIVE
NITRITE: NEGATIVE
Protein, ur: NEGATIVE mg/dL
SPECIFIC GRAVITY, URINE: 1.019 (ref 1.005–1.030)
pH: 7.5 (ref 5.0–8.0)

## 2017-03-03 MED ORDER — SODIUM CHLORIDE 0.9 % IV BOLUS (SEPSIS)
500.0000 mL | Freq: Once | INTRAVENOUS | Status: AC
  Administered 2017-03-03: 500 mL via INTRAVENOUS

## 2017-03-03 MED ORDER — IOPAMIDOL (ISOVUE-300) INJECTION 61%
100.0000 mL | Freq: Once | INTRAVENOUS | Status: AC | PRN
  Administered 2017-03-03: 100 mL via INTRAVENOUS

## 2017-03-03 NOTE — ED Triage Notes (Signed)
RLQ abd pain x 3 days, denies N/V/D, denies urinary symptoms.

## 2017-03-03 NOTE — ED Provider Notes (Signed)
[facility] Provider Note   CSN: [id number] Arrival date & time: 03/03/17  1656     History   Chief Complaint Chief Complaint  Patient presents with  . Abdominal Pain    HPI Barry Sims is a 61 y.o. male.  HPI Patient presents with right lower quadrant abdominal pain. His had the last 3 days. As dull. May have slight urinary difficulty. No fevers. No nausea vomiting or diarrhea. No change in his bowel movements. Has not had pains like this before. The bumps it hurt him on the way here. Appetite is been mildly decreased. States that his wife made him to come into the hospital today. Past Medical History:  Diagnosis Date  . Allergy   . 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 Arthritis   . Diabetes mellitus   . G E R D 02/13/2007  . GERD (gastroesophageal reflux disease)   . HYPERCHOLESTEROLEMIA 03/19/2007  . Hyperlipidemia   . Obesity   . OBESITY 02/13/2007  . Rheumatic fever   . TOBACCO USE 02/13/2007    Patient Active Problem List   Diagnosis Date Noted  . 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 HYPERCHOLESTEROLEMIA 03/19/2007  . OBESITY 02/13/2007  . TOBACCO USE 02/13/2007  . G E R D 02/13/2007    Past Surgical History:  Procedure Laterality Date  . BACK SURGERY  1989       Home Medications    Prior to Admission medications   Medication Sig Start Date End Date Taking? Authorizing Provider  ibuprofen (ADVIL) 200 MG tablet Take 400 mg by mouth every 6 (six) hours as needed.    [provider]  metFORMIN (GLUCOPHAGE) 500 MG tablet TAKE ONE TABLET BY MOUTH TWICE DAILY WITH MEALS 04/12/15   Gordy SaversKwiatkowski, Peter F, MD    Family History Family History  Problem Relation Age of Onset  . Cancer Father   . COPD Sister   . Heart disease Brother   . Prostate cancer Brother     Social History Social History  Substance Use Topics  . Smoking status: Current Every Day Smoker    Packs/day: 1.00    Types: Cigarettes  . Smokeless tobacco: Never Used  . Alcohol use No      Allergies   Penicillins   Review of Systems Review of Systems  Constitutional: Positive for appetite change. Negative for fever.  HENT: Negative for congestion.   Gastrointestinal: Positive for abdominal pain. Negative for constipation, diarrhea, nausea and vomiting.  Endocrine: Negative for polyuria.  Genitourinary: Negative for frequency.  Musculoskeletal: Negative for back pain.  Skin: Negative for rash.  Neurological: Negative for seizures.  Hematological: Negative for adenopathy.  Psychiatric/Behavioral: Negative for confusion.     Physical Exam Updated Vital Signs BP 120/75 (BP Location: Left Arm)   Pulse (!) 59   Temp 98.8 F (37.1 C) (Oral)   Resp 18   Ht 5\' 10"  (1.778 m)   Wt 95.3 kg (210 lb)   SpO2 98%   BMI 30.13 kg/m   Physical Exam  Constitutional: He appears well-developed.  HENT:  Head: Atraumatic.  Eyes: Pupils are equal, round, and reactive to light.  Neck: Neck supple.  Cardiovascular: Normal rate.   Pulmonary/Chest: Effort normal.  Abdominal: Soft. There is tenderness.  Right lower quadrant tenderness without rebound or guarding. No perineal tenderness.  Musculoskeletal: Normal range of motion.  Neurological: He is alert.  Skin: Skin is warm. Capillary refill takes less than 2 seconds.  Psychiatric: He has a normal mood and affect.  ED Treatments / Results  Labs (all labs ordered are listed, but only abnormal results are displayed) Labs Reviewed  URINALYSIS, ROUTINE W REFLEX MICROSCOPIC - Abnormal; Notable for the following:       Result Value   APPearance CLOUDY (*)    All other components within normal limits  COMPREHENSIVE METABOLIC PANEL - Abnormal; Notable for the following:    Glucose, Bld 114 (*)    Calcium 8.7 (*)    ALT 15 (*)    All other components within normal limits  CBC WITH DIFFERENTIAL/PLATELET    EKG  EKG Interpretation None       Radiology Ct Abdomen Pelvis W Contrast  Result Date:  03/03/2017 CLINICAL DATA:  Right lower quadrant pain for 3 days. EXAM: CT ABDOMEN AND PELVIS WITH CONTRAST TECHNIQUE: Multidetector CT imaging of the abdomen and pelvis was performed using the standard protocol following bolus administration of intravenous contrast. CONTRAST:  ISOVUE-300 IOPAMIDOL (ISOVUE-300) INJECTION 61% COMPARISON:  CT 10/01/2014. FINDINGS: Lower chest: The lung bases are clear. No pleural fluid or consolidation. Hepatobiliary: No focal liver abnormality is seen. No gallstones, gallbladder wall thickening, or biliary dilatation. Pancreas: Mild parenchymal atrophy. No ductal dilatation or inflammation. Spleen: Normal in size without focal abnormality. Adrenals/Urinary Tract: Normal adrenal glands. Mild prominence of the right renal collecting system without hydronephrosis. Homogeneous renal enhancement with symmetric excretion on delayed phase imaging. Urinary bladder is minimally distended, mild thick wall. Questionable perivesicular edema about the dome. Stomach/Bowel: Normal appendix. Stomach distended with enteric contrast. No small bowel inflammation, distention or wall thickening. Small to moderate colonic stool burden no colonic wall thickening. Minimal diverticulosis of the descending colon without acute inflammation. Vascular/Lymphatic: Minimal iliac atherosclerosis. Abdominal aorta is normal in caliber. No abdominal or pelvic adenopathy. Reproductive: Prominent prostate gland. Other: No free air, free fluid, or intra-abdominal fluid collection. Musculoskeletal: There are no acute or suspicious osseous abnormalities. Degenerative change in the spine. IMPRESSION: 1. Mild bladder wall thickening may reflect cystitis. 2. No additional acute abnormality in the abdomen or pelvis. Particularly, normal appendix. 3. Mild descending colonic diverticulosis without acute inflammation. 4. Mild iliac atherosclerosis. Electronically Signed   By: Rubye Oaks M.D.   On: 03/03/2017 20:08     Procedures Procedures (including critical care time)  Medications Ordered in ED Medications  sodium chloride 0.9 % bolus 500 mL (0 mLs Intravenous Stopped 03/03/17 1919)  iopamidol (ISOVUE-300) 61 % injection 100 mL (100 mLs Intravenous Contrast Given 03/03/17 1932)     Initial Impression / Assessment and Plan / ED Course  I have reviewed the triage vital signs and the nursing notes.  Pertinent labs & imaging results that were available during my care of the patient were reviewed by me and considered in my medical decision making (see chart for details).     Patient with abdominal pain. Labs and urine reassuring. CT scan showed no appendicitis. Did have potential wall thickening of the bladder but urine did not show infection. Patient informed of this and will follow-up as an outpatient. Discharge home.  Final Clinical Impressions(s) / ED Diagnoses   Final diagnoses:  Right lower quadrant abdominal pain    New Prescriptions New Prescriptions   No medications on file     Benjiman Core, MD 03/03/17 2042

## 2017-03-20 ENCOUNTER — Encounter: Payer: Self-pay | Admitting: *Deleted

## 2017-03-20 ENCOUNTER — Emergency Department
Admission: EM | Admit: 2017-03-20 | Discharge: 2017-03-20 | Disposition: A | Discharge status: Home / Self Care | Payer: BLUE CROSS/BLUE SHIELD | Attending: Emergency Medicine | Admitting: Emergency Medicine

## 2017-03-20 DIAGNOSIS — Z79899 Other long term (current) drug therapy: Secondary | ICD-10-CM | POA: Insufficient documentation

## 2017-03-20 DIAGNOSIS — Z7984 Long term (current) use of oral hypoglycemic drugs: Secondary | ICD-10-CM | POA: Diagnosis not present

## 2017-03-20 DIAGNOSIS — F17210 Nicotine dependence, cigarettes, uncomplicated: Secondary | ICD-10-CM | POA: Diagnosis not present

## 2017-03-20 DIAGNOSIS — E119 Type 2 diabetes mellitus without complications: Secondary | ICD-10-CM | POA: Insufficient documentation

## 2017-03-20 DIAGNOSIS — R42 Dizziness and giddiness: Secondary | ICD-10-CM | POA: Diagnosis not present

## 2017-03-20 LAB — CBC WITH DIFFERENTIAL/PLATELET
Basophils Absolute: 0 10*3/uL (ref 0–0.1)
Basophils Relative: 1 %
EOS ABS: 0.2 10*3/uL (ref 0–0.7)
EOS PCT: 3 %
HCT: 42.3 % (ref 40.0–52.0)
HEMOGLOBIN: 14.5 g/dL (ref 13.0–18.0)
Lymphocytes Relative: 21 %
Lymphs Abs: 1.1 10*3/uL (ref 1.0–3.6)
MCH: 32 pg (ref 26.0–34.0)
MCHC: 34.3 g/dL (ref 32.0–36.0)
MCV: 93.5 fL (ref 80.0–100.0)
MONO ABS: 0.3 10*3/uL (ref 0.2–1.0)
MONOS PCT: 5 %
NEUTROS PCT: 70 %
Neutro Abs: 3.7 10*3/uL (ref 1.4–6.5)
Platelets: 197 10*3/uL (ref 150–440)
RBC: 4.52 MIL/uL (ref 4.40–5.90)
RDW: 13.8 % (ref 11.5–14.5)
WBC: 5.2 10*3/uL (ref 3.8–10.6)

## 2017-03-20 LAB — BASIC METABOLIC PANEL
Anion gap: 6 (ref 5–15)
BUN: 21 mg/dL — AB (ref 6–20)
CHLORIDE: 109 mmol/L (ref 101–111)
CO2: 23 mmol/L (ref 22–32)
CREATININE: 1.03 mg/dL (ref 0.61–1.24)
Calcium: 8.3 mg/dL — ABNORMAL LOW (ref 8.9–10.3)
GFR calc Af Amer: >60 mL/min (ref >60)
GFR calc non Af Amer: >60 mL/min (ref >60)
GLUCOSE: 176 mg/dL — AB (ref 65–99)
Potassium: 4.2 mmol/L (ref 3.5–5.1)
SODIUM: 138 mmol/L (ref 135–145)

## 2017-03-20 MED ORDER — ONDANSETRON 4 MG PO TBDP
ORAL_TABLET | ORAL | Status: AC
  Filled 2017-03-20: qty 1

## 2017-03-20 MED ORDER — MECLIZINE HCL 25 MG PO TABS
25.0000 mg | ORAL_TABLET | Freq: Once | ORAL | Status: AC
  Administered 2017-03-20: 25 mg via ORAL
  Filled 2017-03-20: qty 1

## 2017-03-20 MED ORDER — ONDANSETRON HCL 4 MG PO TABS: 4.0000 mg | ORAL_TABLET | Freq: Three times a day (TID) | ORAL | 0 refills | Status: DC | PRN

## 2017-03-20 MED ORDER — DIAZEPAM 5 MG PO TABS
5.0000 mg | ORAL_TABLET | Freq: Once | ORAL | Status: AC
  Administered 2017-03-20: 5 mg via ORAL
  Filled 2017-03-20: qty 1

## 2017-03-20 MED ORDER — DIAZEPAM 5 MG PO TABS: 5.0000 mg | ORAL_TABLET | Freq: Three times a day (TID) | ORAL | 0 refills | Status: AC | PRN

## 2017-03-20 MED ORDER — ONDANSETRON 4 MG PO TBDP
4.0000 mg | ORAL_TABLET | Freq: Once | ORAL | Status: AC
  Administered 2017-03-20: 4 mg via ORAL

## 2017-03-20 NOTE — ED Provider Notes (Signed)
[facility] Emergency Department Provider Note   ____________________________________________   I have reviewed the triage vital signs and the nursing notes.   HISTORY  Chief Complaint Dizziness   History limited by: Not Limited   HPI Barry Sims is a 61 y.o. male who presents to the emergency department today because of concern for dizziness. It started two weeks ago. Had been getting better but was worse this morning. It is the sensation of the world spinning around him. It is worse with movement. Gets better once he is still. It was accompanied by nausea and vomiting today. Patient states that he has been having some sinus issues recently. Denies similar symptoms in the past. Was seen in an ER roughly 2-3 weeks ago for abdominal pain which he states has improved. Was not started on medication.    Past Medical History:  Diagnosis Date  . Allergy   . 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 Arthritis   . Diabetes mellitus   . G E R D 02/13/2007  . GERD (gastroesophageal reflux disease)   . HYPERCHOLESTEROLEMIA 03/19/2007  . Hyperlipidemia   . Obesity   . OBESITY 02/13/2007  . Rheumatic fever   . TOBACCO USE 02/13/2007    Patient Active Problem List   Diagnosis Date Noted  . 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 HYPERCHOLESTEROLEMIA 03/19/2007  . OBESITY 02/13/2007  . TOBACCO USE 02/13/2007  . G E R D 02/13/2007    Past Surgical History:  Procedure Laterality Date  . BACK SURGERY  1989    Prior to Admission medications   Medication Sig Start Date End Date Taking? Authorizing Provider  ibuprofen (ADVIL) 200 MG tablet Take 400 mg by mouth every 6 (six) hours as needed.    [provider]  metFORMIN (GLUCOPHAGE) 500 MG tablet TAKE ONE TABLET BY MOUTH TWICE DAILY WITH MEALS 04/12/15   Gordy SaversKwiatkowski, Peter F, MD    Allergies Penicillins  Family History  Problem Relation Age of Onset  . Cancer Father   . COPD Sister   . Heart disease Brother   . Prostate cancer Brother      Social History Social History  Substance Use Topics  . Smoking status: Current Every Day Smoker    Packs/day: 1.00    Types: Cigarettes  . Smokeless tobacco: Never Used  . Alcohol use No    Review of Systems Constitutional: No fever/chills Eyes: Positive for sensation of spinning. ENT: Positive for sore throat.  Cardiovascular: Denies chest pain. Respiratory: Denies shortness of breath. Gastrointestinal: Positive for abdominal pain two weeks ago that has resolved.    Genitourinary: Negative for dysuria. Musculoskeletal: Negative for back pain. Skin: Negative for rash. Neurological: Negative for headaches, focal weakness or numbness. Positive for dizziness.   ____________________________________________   PHYSICAL EXAM:  VITAL SIGNS: ED Triage Vitals [03/20/17 0702]  Enc Vitals Group     BP 130/72     Pulse Rate 72     Resp 16     Temp 98.6 F (37 C)     Temp Source Oral     SpO2 95 %     Weight 210 lb (95.3 kg)     Height 5\' 10"  (1.778 m)     Head Circumference      Peak Flow      Pain Score 0   Constitutional: Alert and oriented. Well appearing and in no distress. Eyes: Conjunctivae are normal.  ENT   Head: Normocephalic and atraumatic.   Nose: No congestion/rhinnorhea.  Mouth/Throat: Mucous membranes are moist.   Neck: No stridor. Hematological/Lymphatic/Immunilogical: No cervical lymphadenopathy. Cardiovascular: Normal rate, regular rhythm.  No murmurs, rubs, or gallops.  Respiratory: Normal respiratory effort without tachypnea nor retractions. Breath sounds are clear and equal bilaterally. No wheezes/rales/rhonchi. Gastrointestinal: Soft and non tender. No rebound. No guarding.  Genitourinary: Deferred Musculoskeletal: Normal range of motion in all extremities. No lower extremity edema. Neurologic:  Normal speech and language. Strength 5/5 in upper and lower extremities. Finger to nose normal. Romberg normal. No gross focal neurologic  deficits are appreciated.  Skin:  Skin is warm, dry and intact. No rash noted. Psychiatric: Mood and affect are normal. Speech and behavior are normal. Patient exhibits appropriate insight and judgment.  ____________________________________________    LABS (pertinent positives/negatives)  Labs Reviewed  BASIC METABOLIC PANEL - Abnormal; Notable for the following:       Result Value   Glucose, Bld 176 (*)    BUN 21 (*)    Calcium 8.3 (*)    All other components within normal limits  CBC WITH DIFFERENTIAL/PLATELET    ____________________________________________   EKG  I, Phineas Semen, attending physician, personally viewed and interpreted this EKG  EKG Time: 0703 Rate: 60 Rhythm: normal sinus rhythm Axis: normal Intervals: qtc 408 QRS: narrow ST changes: no st elevation Impression: normal ekg  ____________________________________________    RADIOLOGY  None  ____________________________________________   PROCEDURES  Procedures  ____________________________________________   INITIAL IMPRESSION / ASSESSMENT AND PLAN / ED COURSE  Pertinent labs & imaging results that were available during my care of the patient were reviewed by me and considered in my medical decision making (see chart for details).  Patient presents to the emergency department today because of concerns for dizziness and nausea. This point I do think vertigo likely. Patient was given medication did seem to respond better to Valium. Was able to get up and walk around. I did discuss with patient possibility of stroke and obtaining MRI however patient declined at this time. Felt comfortable with plan for symptomatic treatment and follow-up with ENT which I believe is reasonable at this time. Will give prescriptions and ENT follow-up information.  ____________________________________________   FINAL CLINICAL IMPRESSION(S) / ED DIAGNOSES  Final diagnoses:  Vertigo     Note: This dictation  was prepared with Dragon dictation. Any transcriptional errors that result from this process are unintentional     Phineas Semen, MD 03/20/17 [phone]

## 2017-03-20 NOTE — ED Triage Notes (Signed)
Pt states dizziness for 2 weeks, states this AM he began vomiting, awake and alert in no acute distress

## 2017-03-20 NOTE — Discharge Instructions (Signed)
Please seek medical attention for any high fevers, chest pain, shortness of breath, change in behavior, persistent vomiting, bloody stool or any other new or concerning symptoms.  

## 2017-03-20 NOTE — ED Notes (Signed)
Patient ambulated around nurses station. Reporting that dizziness is "better than it was". Patient reports slight increase in nausea with movement. MD made aware. Will continue to monitor.

## 2017-03-20 NOTE — ED Notes (Signed)
Patient with one episode of vomiting. MD made aware. See new orders. Will continue to monitor.

## 2017-04-05 ENCOUNTER — Ambulatory Visit
Admission: RE | Admit: 2017-04-05 | Discharge: 2017-04-05 | Disposition: A | Discharge status: Home / Self Care | Payer: BLUE CROSS/BLUE SHIELD | Source: Ambulatory Visit | Attending: Unknown Physician Specialty | Admitting: Unknown Physician Specialty

## 2017-04-05 ENCOUNTER — Other Ambulatory Visit: Payer: Self-pay | Admitting: Unknown Physician Specialty

## 2017-04-05 DIAGNOSIS — J342 Deviated nasal septum: Secondary | ICD-10-CM | POA: Diagnosis not present

## 2017-04-05 DIAGNOSIS — R519 Headache, unspecified: Secondary | ICD-10-CM

## 2017-04-05 DIAGNOSIS — R51 Headache: Secondary | ICD-10-CM | POA: Insufficient documentation

## 2017-12-13 IMAGING — CR DG SINUSES COMPLETE 3+V
4 series · 4 of 4 positions shown · non-contrast
Comparison: None.

CLINICAL DATA: Congestion with nasal drainage in sinus pressure for
2 weeks

EXAM:
PARANASAL SINUSES - COMPLETE 3 + VIEW

[[person_name] (1 of 3)]
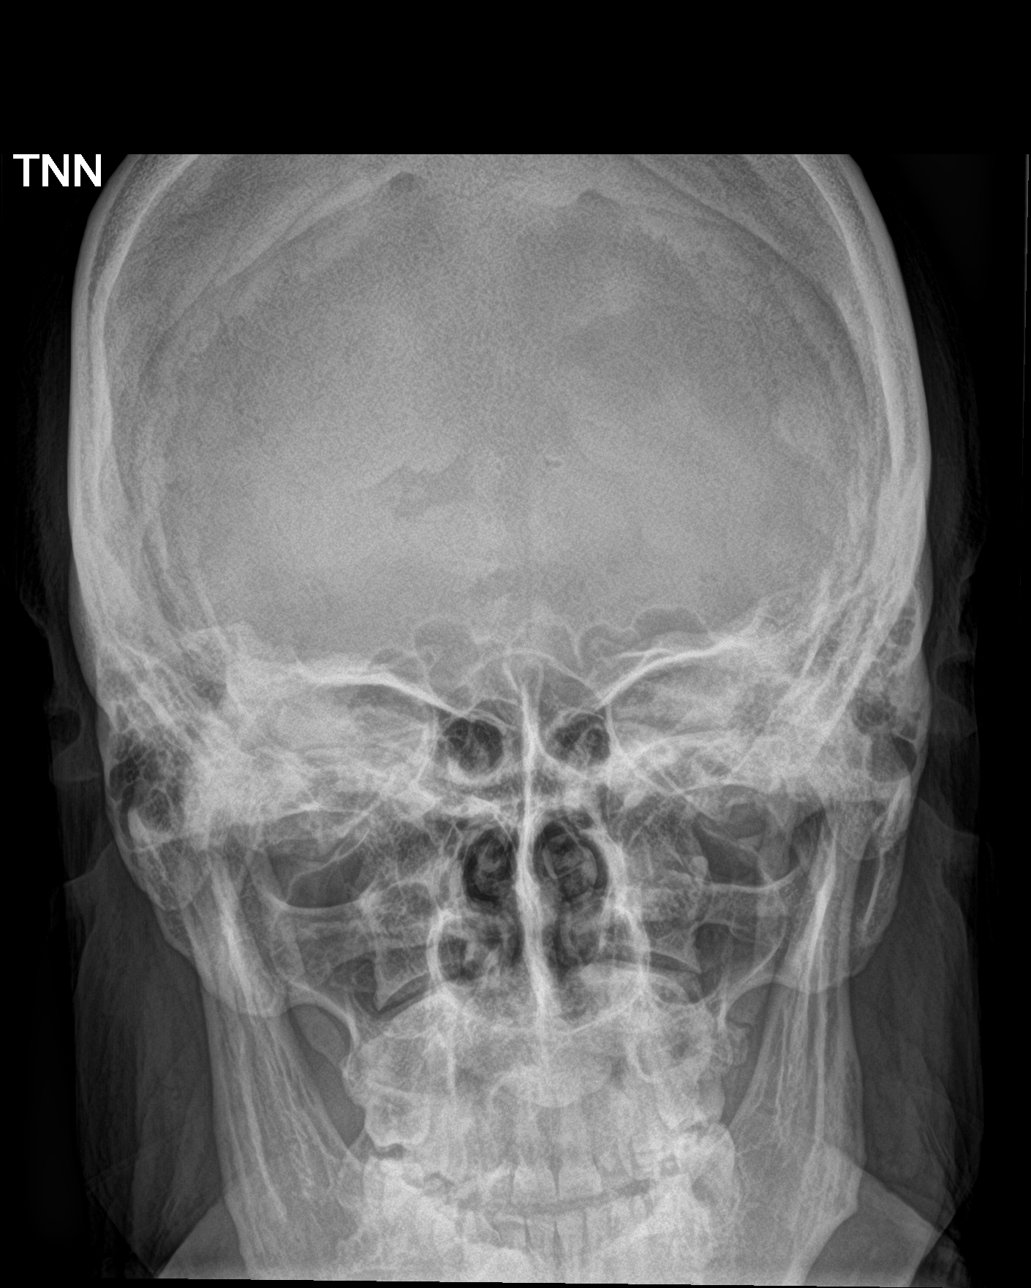

[[person_name] (2 of 3)]
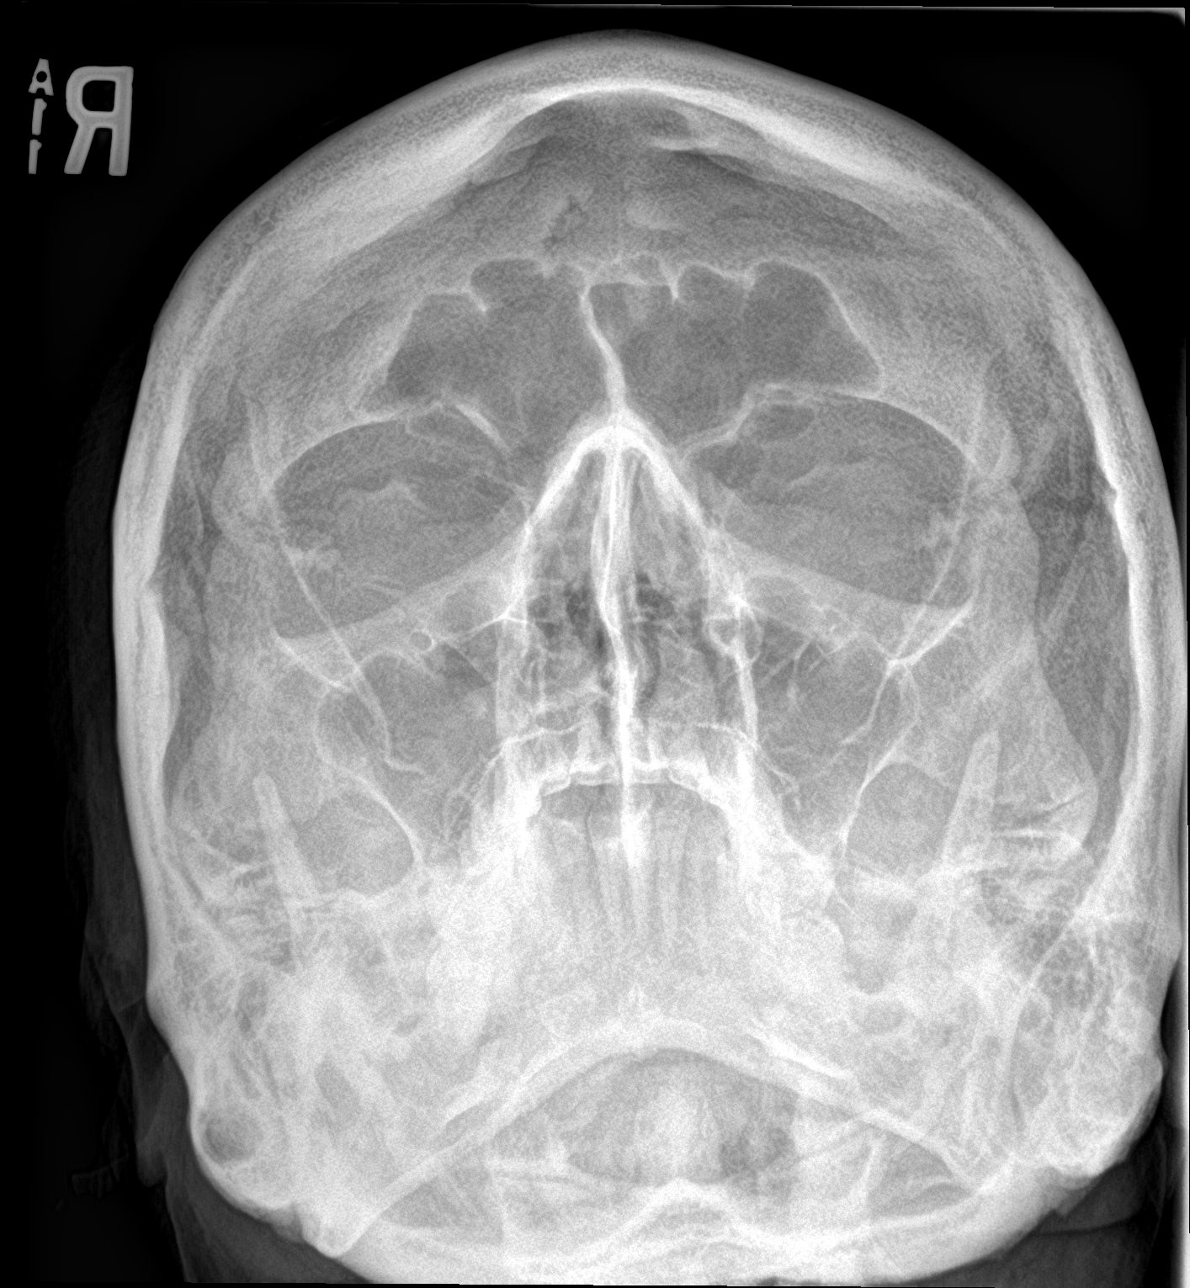

[[person_name] (3 of 3)]
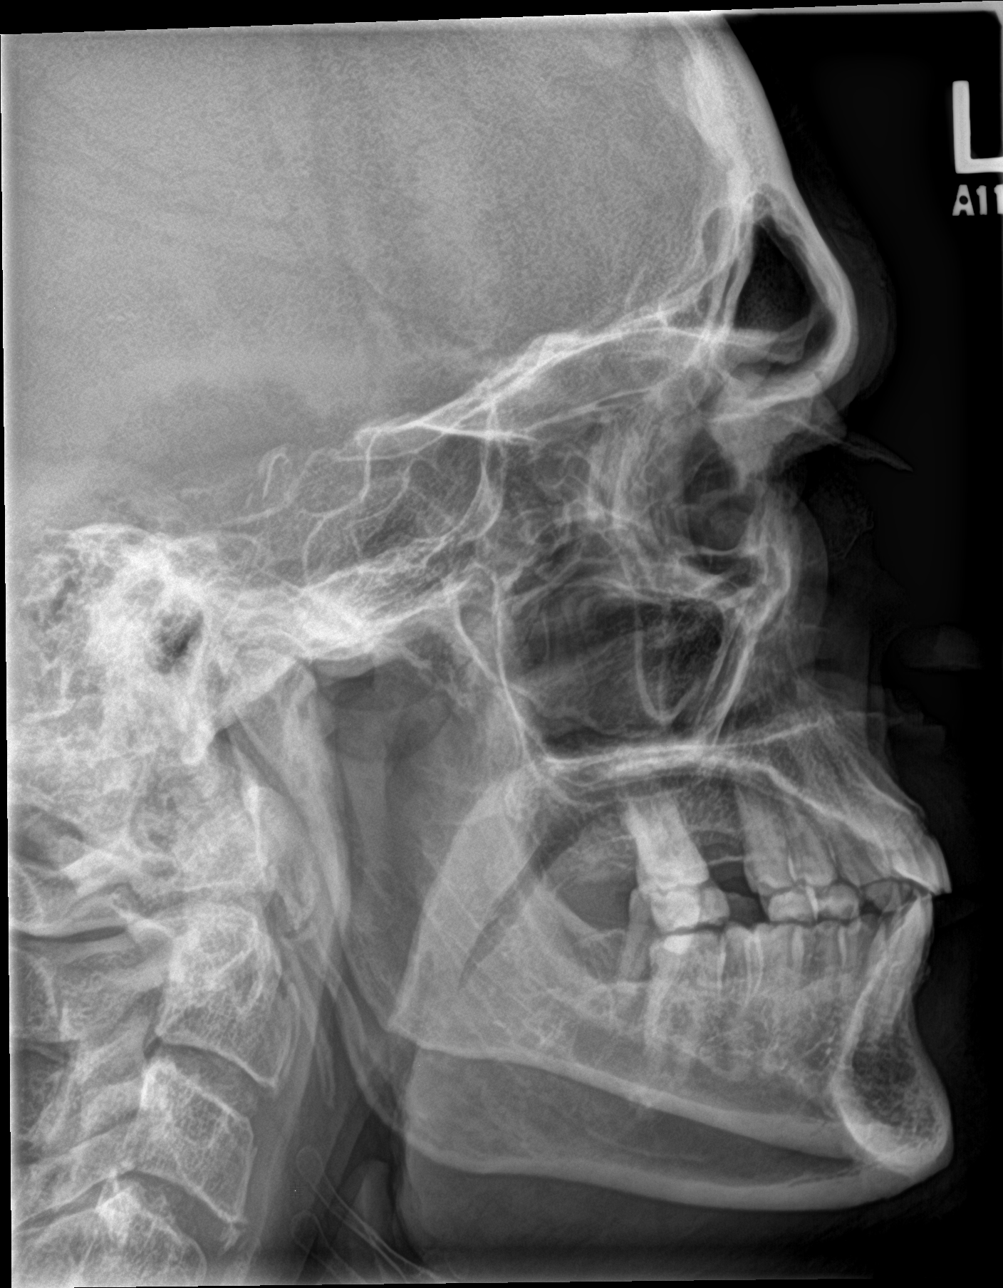

[facial smv]
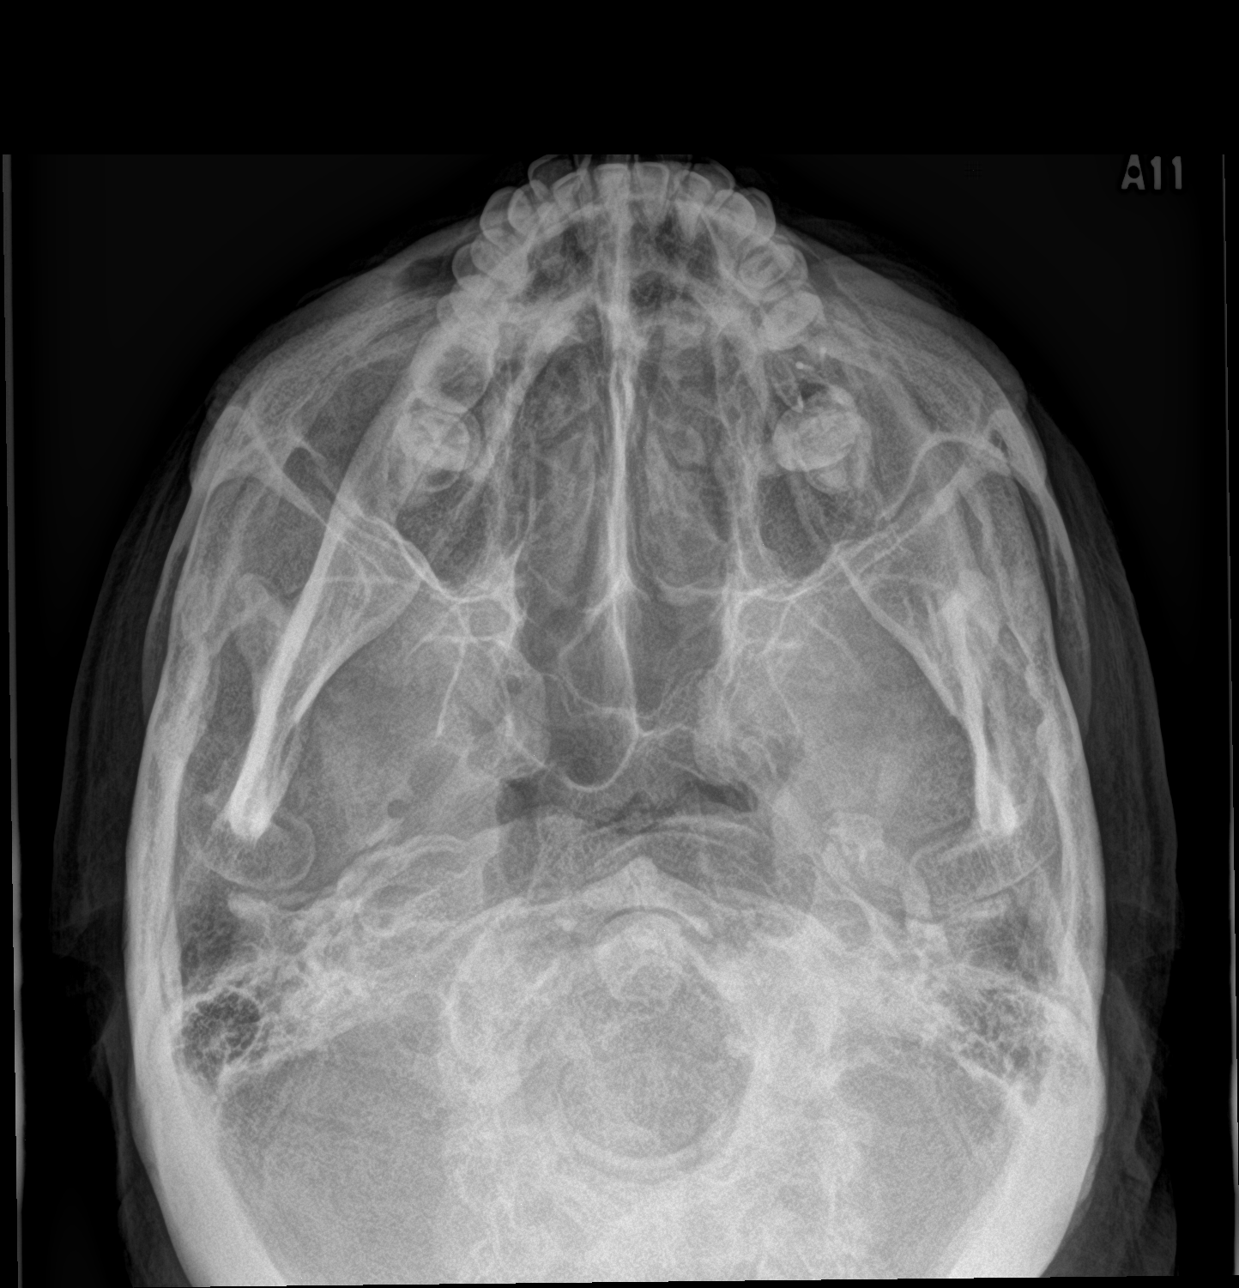

[4 of 4 positions shown; findings below may reference images not displayed]

FINDINGS: Verdel, [place], lateral, and submentovertex images were obtained.
Paranasal sinuses are clear. No air-fluid level. No bony destruction
or expansion. There is minimal rightward deviation of the mid nasal
septum. Mastoids appear clear.
IMPRESSION: Paranasal sinuses clear.  Slight nasal septal deviation.

## 2018-03-31 ENCOUNTER — Emergency Department
Admission: EM | Admit: 2018-03-31 | Discharge: 2018-03-31 | Disposition: A | Discharge status: Home / Self Care | Payer: Worker's Compensation | Attending: Emergency Medicine | Admitting: Emergency Medicine

## 2018-03-31 ENCOUNTER — Emergency Department: Payer: Worker's Compensation

## 2018-03-31 ENCOUNTER — Other Ambulatory Visit: Payer: Self-pay

## 2018-03-31 ENCOUNTER — Encounter: Payer: Self-pay | Admitting: Emergency Medicine

## 2018-03-31 DIAGNOSIS — Y939 Activity, unspecified: Secondary | ICD-10-CM | POA: Diagnosis not present

## 2018-03-31 DIAGNOSIS — S161XXA Strain of muscle, fascia and tendon at neck level, initial encounter: Secondary | ICD-10-CM | POA: Diagnosis not present

## 2018-03-31 DIAGNOSIS — Y998 Other external cause status: Secondary | ICD-10-CM | POA: Insufficient documentation

## 2018-03-31 DIAGNOSIS — F17210 Nicotine dependence, cigarettes, uncomplicated: Secondary | ICD-10-CM | POA: Diagnosis not present

## 2018-03-31 DIAGNOSIS — E7800 Pure hypercholesterolemia, unspecified: Secondary | ICD-10-CM | POA: Diagnosis not present

## 2018-03-31 DIAGNOSIS — S199XXA Unspecified injury of neck, initial encounter: Secondary | ICD-10-CM | POA: Diagnosis present

## 2018-03-31 DIAGNOSIS — E119 Type 2 diabetes mellitus without complications: Secondary | ICD-10-CM | POA: Insufficient documentation

## 2018-03-31 DIAGNOSIS — Y92410 Unspecified street and highway as the place of occurrence of the external cause: Secondary | ICD-10-CM | POA: Insufficient documentation

## 2018-03-31 DIAGNOSIS — S39012A Strain of muscle, fascia and tendon of lower back, initial encounter: Secondary | ICD-10-CM | POA: Insufficient documentation

## 2018-03-31 DIAGNOSIS — E785 Hyperlipidemia, unspecified: Secondary | ICD-10-CM | POA: Diagnosis not present

## 2018-03-31 MED ORDER — IBUPROFEN 600 MG PO TABS: 600.0000 mg | ORAL_TABLET | Freq: Three times a day (TID) | ORAL | 0 refills | Status: DC | PRN

## 2018-03-31 MED ORDER — METHOCARBAMOL 500 MG PO TABS: 500.0000 mg | ORAL_TABLET | Freq: Four times a day (QID) | ORAL | 0 refills | Status: DC | PRN

## 2018-03-31 MED ORDER — HYDROCODONE-ACETAMINOPHEN 5-325 MG PO TABS: 1.0000 | ORAL_TABLET | Freq: Four times a day (QID) | ORAL | 0 refills | Status: DC | PRN
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
## 2018-03-31 NOTE — Discharge Instructions (Addendum)
Follow-up with your primary care provider if any continued problems. Begin taking ibuprofen 600 mg every 8 hours with food as needed for inflammation and stiffness.  Robaxin 500 mg every 6 hours as needed for muscle spasms and hydrocodone every 6 hours as needed for pain.  Do not take Robaxin and hydrocodone while driving or operating machinery as it could cause drowsiness and increase your risk for injury.  Moist heat or ice to your muscles as needed for discomfort.  Most likely you will be sore and stiff for approximately 4 to 5 days.

## 2019-01-19 ENCOUNTER — Other Ambulatory Visit: Payer: Self-pay

## 2019-01-19 ENCOUNTER — Encounter: Payer: Self-pay | Admitting: Family Medicine

## 2019-01-19 ENCOUNTER — Ambulatory Visit (INDEPENDENT_AMBULATORY_CARE_PROVIDER_SITE_OTHER): Payer: BC Managed Care – PPO | Admitting: Family Medicine

## 2019-01-19 DIAGNOSIS — M542 Cervicalgia: Secondary | ICD-10-CM

## 2019-01-19 MED ORDER — TIZANIDINE HCL 2 MG PO TABS: 2.0000 mg | ORAL_TABLET | Freq: Four times a day (QID) | ORAL | 1 refills | Status: DC | PRN

## 2019-01-19 MED ORDER — NABUMETONE 750 MG PO TABS: 750.0000 mg | ORAL_TABLET | Freq: Two times a day (BID) | ORAL | 6 refills | Status: DC | PRN
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
## 2019-01-29 ENCOUNTER — Ambulatory Visit: Payer: BC Managed Care – PPO | Admitting: Physical Therapy

## 2019-04-06 ENCOUNTER — Telehealth: Payer: Self-pay | Admitting: Family Medicine

## 2019-04-06 NOTE — Telephone Encounter (Signed)
Patient called and stated that at his last appt Dr Junius Roads recommended him to have a MRI. Patient states his job is requesting a letter stating why an MRI is needed.  Please call patient for any additional info.  [phone]

## 2019-04-06 NOTE — Telephone Encounter (Signed)
The patient called and said he continues to have pain in the neck and arms, with numbness in the hands. He reports that he had 2 days of stiffness in the neck over the weekend - was painful to turn his head. He would like to proceed with the [facility] MRI, but needs a statement as to why he needs the MRI to give to his employer - the employer will then open the workman's comp case again and call to give [place] the claim information when available.

## 2019-04-06 NOTE — Telephone Encounter (Signed)
Letter printed.

## 2019-04-07 NOTE — Telephone Encounter (Signed)
I advised the patient the letter is ready for pickup. His father-in-law, Richarda Osmond, will be picking this up.

## 2020-03-29 ENCOUNTER — Other Ambulatory Visit: Payer: Self-pay

## 2020-03-29 ENCOUNTER — Ambulatory Visit (INDEPENDENT_AMBULATORY_CARE_PROVIDER_SITE_OTHER): Payer: BC Managed Care – PPO | Admitting: Family Medicine

## 2020-03-29 ENCOUNTER — Encounter: Payer: Self-pay | Admitting: Family Medicine

## 2020-03-29 DIAGNOSIS — M542 Cervicalgia: Secondary | ICD-10-CM | POA: Diagnosis not present

## 2020-03-29 DIAGNOSIS — M79602 Pain in left arm: Secondary | ICD-10-CM | POA: Diagnosis not present

## 2020-03-29 DIAGNOSIS — M25511 Pain in right shoulder: Secondary | ICD-10-CM | POA: Diagnosis not present

## 2020-03-29 MED ORDER — TRAMADOL HCL 50 MG PO TABS: 50.0000 mg | ORAL_TABLET | Freq: Four times a day (QID) | ORAL | 0 refills | Status: DC | PRN

## 2020-03-29 MED ORDER — BACLOFEN 10 MG PO TABS: 5.0000 mg | ORAL_TABLET | Freq: Every evening | ORAL | 3 refills | Status: DC | PRN

## 2020-03-29 NOTE — Progress Notes (Signed)
Office Visit Note   Patient: Barry Sims           Date of Birth: 02-Jun-1956           MRN: [id number] Visit Date: 03/29/2020 Requested by: No referring provider defined for this encounter. PCP: Gordy Savers, MD (Inactive)  Subjective: Chief Complaint  Patient presents with  . Neck - Pain    HPI: He is here with neck and left arm pain as well as right shoulder pain.  Chronic pain in his neck and left shoulder and arm.  I saw him for this in the past, related to an injury at work in 2019.  Ultimately we were going to order MRI scan but his claim was denied.  He has been dealing with this pain since then but is getting worse to the point that he has trouble sleeping at night.  He wondered whether an injection might help.  He has had a history of right shoulder impingement in the past which responded well to subacromial injection.  This was done by Arlys John when the patient saw Dr. Cleophas Dunker.  Is been hurting again he would like another injection.               ROS:   All other systems were reviewed and are negative.  Objective: Vital Signs: There were no vitals taken for this visit.  Physical Exam:  General:  Alert and oriented, in no acute distress. Pulm:  Breathing unlabored. Psy:  Normal mood, congruent affect.  Neck: Full range of motion, Spurling's test equivocal.  Upper extremity strength and reflexes remain normal.  Tender trigger point in the left trapezius belly that seems to reproduce his pain. Right shoulder: Full active range of motion, positive impingement signs.  Tender in the posterior subacromial space.  Mild AC joint tenderness.  Imaging: No results found.  Assessment & Plan: 1.   Chronic neck and left arm pain concerning for radiculopathy -Elected to inject the symptomatic trigger point today.  If symptoms persist we will order MRI of the cervical spine.  2.  Right shoulder impingement -Subacromial injection today.     Procedures: Right  shoulder subacromial injection: After sterile prep with Betadine, injected 5 cc 1% lidocaine without epinephrine and 40 mg methylprednisolone from posterior approach into the subacromial space.  Left trapezius trigger point injection: After sterile prep with Betadine, injected 3 cc 1% lidocaine without epinephrine and 40 mg methylprednisolone into the symptomatic trigger point.    PMFS History: Patient Active Problem List   Diagnosis Date Noted  . 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 HYPERCHOLESTEROLEMIA 03/19/2007  . OBESITY 02/13/2007  . TOBACCO USE 02/13/2007  . G E R D 02/13/2007   Past Medical History:  Diagnosis Date  . Allergy   . 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 Arthritis   . Diabetes mellitus   . G E R D 02/13/2007  . GERD (gastroesophageal reflux disease)   . HYPERCHOLESTEROLEMIA 03/19/2007  . Hyperlipidemia   . Obesity   . OBESITY 02/13/2007  . Rheumatic fever   . TOBACCO USE 02/13/2007    Family History  Problem Relation Age of Onset  . Cancer Father   . COPD Sister   . Heart disease Brother   . Prostate cancer Brother     Past Surgical History:  Procedure Laterality Date  . BACK SURGERY  1989   Social History   Occupational History  . Not on file  Tobacco Use  . Smoking status: Current Every Day  Smoker    Packs/day: 1.00    Types: Cigarettes  . Smokeless tobacco: Never Used  Substance and Sexual Activity  . Alcohol use: No  . Drug use: No  . Sexual activity: Not on file

## 2024-03-26 ENCOUNTER — Other Ambulatory Visit: Payer: Self-pay | Admitting: Neurological Surgery

## 2024-04-27 NOTE — Progress Notes (Signed)
 Surgical Instructions   Your procedure is scheduled on Friday, September 19th. Report to [facility] Main Entrance A at 8:35 A.M., then check in with the Admitting office. Any questions or running late day of surgery: call [phone]  Questions prior to your surgery date: call [phone], Monday-Friday, 8am-4pm. If you experience any cold or flu symptoms such as cough, fever, chills, shortness of breath, etc. between now and your scheduled surgery, please notify us  at the above number.     Remember:  Do not eat or drink after midnight the night before your surgery    Take these medicines the morning of surgery with A SIP OF WATER: NONE     One week prior to surgery, STOP taking any Aspirin (unless otherwise instructed by your surgeon) Naproxen, Ibuprofen , Motrin , Advil , Goody's, BC's, all herbal medications, fish oil, and non-prescription vitamins. This includes your naproxen sodium (ALEVE)             Do NOT Smoke (Tobacco/Vaping) for 24 hours prior to your procedure.  If you use a CPAP at night, you may bring your mask/headgear for your overnight stay.   You will be asked to remove any contacts, glasses, piercing's, hearing aid's, dentures/partials prior to surgery. Please bring cases for these items if needed.    Patients discharged the day of surgery will not be allowed to drive home, and someone needs to stay with them for 24 hours.  SURGICAL WAITING ROOM VISITATION Patients may have no more than 2 support people in the waiting area - these visitors may rotate.   Pre-op nurse will coordinate an appropriate time for 1 ADULT support person, who may not rotate, to accompany patient in pre-op.  Children under the age of 72 must have an adult with them who is not the patient and must remain in the main waiting area with an adult.  If the patient needs to stay at the hospital during part of their recovery, the visitor guidelines for inpatient rooms apply.  Please refer to the  [facility] website for the visitor guidelines for any additional information.   If you received a COVID test during your pre-op visit  it is requested that you wear a mask when out in public, stay away from anyone that may not be feeling well and notify your surgeon if you develop symptoms. If you have been in contact with anyone that has tested positive in the last 10 days please notify you surgeon.      Pre-operative 5 CHG Bathing Instructions   You can play a key role in reducing the risk of infection after surgery. Your skin needs to be as free of germs as possible. You can reduce the number of germs on your skin by washing with CHG (chlorhexidine  gluconate) soap before surgery. CHG is an antiseptic soap that kills germs and continues to kill germs even after washing.   DO NOT use if you have an allergy to chlorhexidine /CHG or antibacterial soaps. If your skin becomes reddened or irritated, stop using the CHG and notify one of our RNs at [phone].   Please shower with the CHG soap starting 4 days before surgery using the following schedule:     Please keep in mind the following:  DO NOT shave, including legs and underarms, starting the day of your first shower.   You may shave your face at any point before/day of surgery.  Place clean sheets on your bed the day you start using CHG soap. Use a  clean washcloth (not used since being washed) for each shower. DO NOT sleep with pets once you start using the CHG.   CHG Shower Instructions:  Wash your face and private area with normal soap. If you choose to wash your hair, wash first with your normal shampoo.  After you use shampoo/soap, rinse your hair and body thoroughly to remove shampoo/soap residue.  Turn the water OFF and apply about 3 tablespoons (45 ml) of CHG soap to a CLEAN washcloth.  Apply CHG soap ONLY FROM YOUR NECK DOWN TO YOUR TOES (washing for 3-5 minutes)  DO NOT use CHG soap on face, private areas, open wounds, or  sores.  Pay special attention to the area where your surgery is being performed.  If you are having back surgery, having someone wash your back for you may be helpful. Wait 2 minutes after CHG soap is applied, then you may rinse off the CHG soap.  Pat dry with a clean towel  Put on clean clothes/pajamas   If you choose to wear lotion, please use ONLY the CHG-compatible lotions that are listed below.  Additional instructions for the day of surgery: DO NOT APPLY any lotions, deodorants, cologne, or perfumes.   Do not bring valuables to the hospital. [facility] is not responsible for any belongings/valuables. Do not wear nail polish, gel polish, artificial nails, or any other type of covering on natural nails (fingers and toes) Do not wear jewelry or makeup Put on clean/comfortable clothes.  Please brush your teeth.  Ask your nurse before applying any prescription medications to the skin.     CHG Compatible Lotions   Aveeno Moisturizing lotion  Cetaphil Moisturizing Cream  Cetaphil Moisturizing Lotion  Clairol Herbal Essence Moisturizing Lotion, Dry Skin  Clairol Herbal Essence Moisturizing Lotion, Extra Dry Skin  Clairol Herbal Essence Moisturizing Lotion, Normal Skin  Curel Age Defying Therapeutic Moisturizing Lotion with Alpha Hydroxy  Curel Extreme Care Body Lotion  Curel Soothing Hands Moisturizing Hand Lotion  Curel Therapeutic Moisturizing Cream, Fragrance-Free  Curel Therapeutic Moisturizing Lotion, Fragrance-Free  Curel Therapeutic Moisturizing Lotion, Original Formula  Eucerin Daily Replenishing Lotion  Eucerin Dry Skin Therapy Plus Alpha Hydroxy Crme  Eucerin Dry Skin Therapy Plus Alpha Hydroxy Lotion  Eucerin Original Crme  Eucerin Original Lotion  Eucerin Plus Crme Eucerin Plus Lotion  Eucerin TriLipid Replenishing Lotion  Keri Anti-Bacterial Hand Lotion  Keri Deep Conditioning Original Lotion Dry Skin Formula Softly Scented  Keri Deep Conditioning Original  Lotion, Fragrance Free Sensitive Skin Formula  Keri Lotion Fast Absorbing Fragrance Free Sensitive Skin Formula  Keri Lotion Fast Absorbing Softly Scented Dry Skin Formula  Keri Original Lotion  Keri Skin Renewal Lotion Keri Silky Smooth Lotion  Keri Silky Smooth Sensitive Skin Lotion  Nivea Body Creamy Conditioning Oil  Nivea Body Extra Enriched Lotion  Nivea Body Original Lotion  Nivea Body Sheer Moisturizing Lotion Nivea Crme  Nivea Skin Firming Lotion  NutraDerm 30 Skin Lotion  NutraDerm Skin Lotion  NutraDerm Therapeutic Skin Cream  NutraDerm Therapeutic Skin Lotion  ProShield Protective Hand Cream  Provon moisturizing lotion  Please read over the following fact sheets that you were given.

## 2024-04-28 ENCOUNTER — Other Ambulatory Visit: Payer: Self-pay

## 2024-04-28 ENCOUNTER — Encounter (HOSPITAL_COMMUNITY): Payer: Self-pay

## 2024-04-28 ENCOUNTER — Encounter (HOSPITAL_COMMUNITY)
Admission: RE | Admit: 2024-04-28 | Discharge: 2024-04-28 | Disposition: A | Discharge status: Home / Self Care | Source: Ambulatory Visit | Attending: Orthopedic Surgery | Admitting: Orthopedic Surgery

## 2024-04-28 VITALS — BP 126/78 | HR 69 | Temp 98.2°F | Resp 17 | Ht 70.0 in | Wt 202.1 lb

## 2024-04-28 DIAGNOSIS — E119 Type 2 diabetes mellitus without complications: Secondary | ICD-10-CM | POA: Insufficient documentation

## 2024-04-28 DIAGNOSIS — Z01818 Encounter for other preprocedural examination: Secondary | ICD-10-CM | POA: Insufficient documentation

## 2024-04-28 DIAGNOSIS — M5412 Radiculopathy, cervical region: Secondary | ICD-10-CM | POA: Diagnosis present

## 2024-04-28 DIAGNOSIS — I440 Atrioventricular block, first degree: Secondary | ICD-10-CM | POA: Insufficient documentation

## 2024-04-28 DIAGNOSIS — Z538 Procedure and treatment not carried out for other reasons: Secondary | ICD-10-CM | POA: Diagnosis not present

## 2024-04-28 LAB — CBC
HCT: 44.8 % (ref 39.0–52.0)
Hemoglobin: 15.1 g/dL (ref 13.0–17.0)
MCH: 32.2 pg (ref 26.0–34.0)
MCHC: 33.7 g/dL (ref 30.0–36.0)
MCV: 95.5 fL (ref 80.0–100.0)
Platelets: 229 K/uL (ref 150–400)
RBC: 4.69 MIL/uL (ref 4.22–5.81)
RDW: 12.5 % (ref 11.5–15.5)
WBC: 6.4 K/uL (ref 4.0–10.5)
nRBC: 0 % (ref 0.0–0.2)

## 2024-04-28 LAB — BASIC METABOLIC PANEL WITH GFR
Anion gap: 11 (ref 5–15)
BUN: 17 mg/dL (ref 8–23)
CO2: 19 mmol/L — ABNORMAL LOW (ref 22–32)
Calcium: 8.8 mg/dL — ABNORMAL LOW (ref 8.9–10.3)
Chloride: 107 mmol/L (ref 98–111)
Creatinine, Ser: 1.15 mg/dL (ref 0.61–1.24)
GFR, Estimated: >60 mL/min (ref >60)
Glucose, Bld: 139 mg/dL — ABNORMAL HIGH (ref 70–99)
Potassium: 4.5 mmol/L (ref 3.5–5.1)
Sodium: 137 mmol/L (ref 135–145)

## 2024-04-28 LAB — TYPE AND SCREEN
ABO/RH(D): O POS
Antibody Screen: NEGATIVE

## 2024-04-28 LAB — SURGICAL PCR SCREEN
MRSA, PCR: NEGATIVE
Staphylococcus aureus: NEGATIVE

## 2024-04-28 LAB — HEMOGLOBIN A1C
Hgb A1c MFr Bld: 5.9 % — ABNORMAL HIGH (ref 4.8–5.6)
Mean Plasma Glucose: 122.63 mg/dL

## 2024-04-28 NOTE — Progress Notes (Signed)
 PCP - No PCP - will use Urgent care if needs to see a doctor Cardiologist - Denies  PPM/ICD - Denies Device Orders - n/a Rep Notified - n/a  Chest x-ray - N/A EKG - 04-28-24 Stress Test - Denies ECHO - Denies Cardiac Cath - Denies  Sleep Study - Denies CPAP - n/a  Fasting Blood Sugar - Per patient, used to be a diabetic. Lost weight and changed lifestyle.  No longer takes medication or checks blood sugars.  Last dose of GLP1 agonist-  Denies GLP1 instructions: n/a  Blood Thinner Instructions:Denies Aspirin Instructions:Denies  ERAS Protcol - NPO PRE-SURGERY Ensure or G2- n/a  COVID TEST- n/a   Anesthesia review: No  Patient denies shortness of breath, fever, cough and chest pain at PAT appointment. Patient denies any respiratory issues at this time.    All instructions explained to the patient, with a verbal understanding of the material. Patient agrees to go over the instructions while at home for a better understanding. Patient also instructed to self quarantine after being tested for COVID-19. The opportunity to ask questions was provided.

## 2024-05-01 ENCOUNTER — Encounter (HOSPITAL_COMMUNITY)
Admission: RE | Disposition: A | Discharge status: Home / Self Care | Payer: Self-pay | Source: Home / Self Care | Attending: Neurological Surgery

## 2024-05-01 ENCOUNTER — Other Ambulatory Visit: Payer: Self-pay

## 2024-05-01 ENCOUNTER — Encounter (HOSPITAL_COMMUNITY): Payer: Self-pay | Admitting: Anesthesiology

## 2024-05-01 ENCOUNTER — Ambulatory Visit (HOSPITAL_COMMUNITY)
Admission: RE | Admit: 2024-05-01 | Discharge: 2024-05-01 | Disposition: A | Discharge status: Home / Self Care | Attending: Neurological Surgery | Admitting: Neurological Surgery

## 2024-05-01 ENCOUNTER — Encounter (HOSPITAL_COMMUNITY): Payer: Self-pay | Admitting: Neurological Surgery

## 2024-05-01 DIAGNOSIS — M5412 Radiculopathy, cervical region: Secondary | ICD-10-CM | POA: Diagnosis not present

## 2024-05-01 DIAGNOSIS — Z538 Procedure and treatment not carried out for other reasons: Secondary | ICD-10-CM | POA: Insufficient documentation

## 2024-05-01 DIAGNOSIS — Z01818 Encounter for other preprocedural examination: Secondary | ICD-10-CM | POA: Insufficient documentation

## 2024-05-01 SURGERY — ANTERIOR CERVICAL DECOMPRESSION/DISCECTOMY FUSION 3 LEVELS
Anesthesia: General

## 2024-05-01 MED ORDER — ACETAMINOPHEN 500 MG PO TABS
1000.0000 mg | ORAL_TABLET | ORAL | Status: DC
  Filled 2024-05-01: qty 2

## 2024-05-01 MED ORDER — LIDOCAINE 2% (20 MG/ML) 5 ML SYRINGE
INTRAMUSCULAR | Status: AC
  Filled 2024-05-01: qty 5

## 2024-05-01 MED ORDER — LACTATED RINGERS IV SOLN: INTRAVENOUS | Status: DC

## 2024-05-01 MED ORDER — CHLORHEXIDINE GLUCONATE 0.12 % MT SOLN
15.0000 mL | Freq: Once | OROMUCOSAL | Status: DC
  Filled 2024-05-01: qty 15

## 2024-05-01 MED ORDER — CEFAZOLIN SODIUM-DEXTROSE 2-4 GM/100ML-% IV SOLN
2.0000 g | INTRAVENOUS | Status: DC
  Filled 2024-05-01: qty 100

## 2024-05-01 MED ORDER — DEXMEDETOMIDINE HCL IN NACL 80 MCG/20ML IV SOLN
INTRAVENOUS | Status: AC
Start: 2024-05-01 — End: 2024-05-01
  Filled 2024-05-01: qty 20

## 2024-05-01 MED ORDER — GABAPENTIN 300 MG PO CAPS
300.0000 mg | ORAL_CAPSULE | ORAL | Status: DC
  Filled 2024-05-01: qty 1

## 2024-05-01 MED ORDER — MIDAZOLAM HCL 2 MG/2ML IJ SOLN
INTRAMUSCULAR | Status: AC
  Filled 2024-05-01: qty 2

## 2024-05-01 MED ORDER — ROCURONIUM BROMIDE 10 MG/ML (PF) SYRINGE
PREFILLED_SYRINGE | INTRAVENOUS | Status: AC
  Filled 2024-05-01: qty 10

## 2024-05-01 MED ORDER — FENTANYL CITRATE (PF) 250 MCG/5ML IJ SOLN
INTRAMUSCULAR | Status: AC
  Filled 2024-05-01: qty 5

## 2024-05-01 MED ORDER — PROPOFOL 10 MG/ML IV BOLUS
INTRAVENOUS | Status: AC
  Filled 2024-05-01: qty 20

## 2024-05-01 MED ORDER — DEXAMETHASONE SODIUM PHOSPHATE 10 MG/ML IJ SOLN
INTRAMUSCULAR | Status: AC
  Filled 2024-05-01: qty 1

## 2024-05-01 MED ORDER — CHLORHEXIDINE GLUCONATE CLOTH 2 % EX PADS: 6.0000 | MEDICATED_PAD | Freq: Once | CUTANEOUS | Status: DC

## 2024-05-01 MED ORDER — ORAL CARE MOUTH RINSE: 15.0000 mL | Freq: Once | OROMUCOSAL | Status: DC

## 2024-05-01 NOTE — Progress Notes (Signed)
 Pt reported runny nose, sore throat, chills, fever, and cough in the last 3 days. Reported symptoms to Dr. Epifanio. Per anesthesia and Dr. Joshua, procedure to be postponed for 2 weeks after symptom resolution. Pt verbalizes understanding. Pt d/c to home with wife.  Joesph LOISE Stake, RN

## 2024-05-18 ENCOUNTER — Other Ambulatory Visit: Payer: Self-pay | Admitting: Neurological Surgery

## 2024-05-18 ENCOUNTER — Encounter (HOSPITAL_COMMUNITY): Payer: Self-pay | Admitting: Neurological Surgery

## 2024-05-18 NOTE — Progress Notes (Signed)
 Patient was updated on arrival time and date of surgery.  Patient states that he still has his instructions and he will review those instructions.  Patient states that he has fully recovered from his recent illness and has no cough.  Patient states that he has no further questions at this time.  Patient aware to use the soap that was given to him previously.

## 2024-05-20 ENCOUNTER — Encounter (HOSPITAL_COMMUNITY): Payer: Self-pay | Admitting: Neurological Surgery

## 2024-05-20 ENCOUNTER — Encounter (HOSPITAL_COMMUNITY)
Admission: RE | Disposition: A | Discharge status: Home / Self Care | Payer: Self-pay | Source: Home / Self Care | Attending: Neurological Surgery

## 2024-05-20 ENCOUNTER — Other Ambulatory Visit: Payer: Self-pay

## 2024-05-20 ENCOUNTER — Inpatient Hospital Stay (HOSPITAL_COMMUNITY)

## 2024-05-20 ENCOUNTER — Inpatient Hospital Stay (HOSPITAL_COMMUNITY): Payer: Self-pay

## 2024-05-20 ENCOUNTER — Inpatient Hospital Stay (HOSPITAL_COMMUNITY)
Admission: RE | Admit: 2024-05-20 | Discharge: 2024-05-21 | DRG: 473 | Disposition: A | Discharge status: Home / Self Care | Attending: Neurological Surgery | Admitting: Neurological Surgery

## 2024-05-20 DIAGNOSIS — Z880 Allergy status to penicillin: Secondary | ICD-10-CM | POA: Diagnosis not present

## 2024-05-20 DIAGNOSIS — M1990 Unspecified osteoarthritis, unspecified site: Secondary | ICD-10-CM | POA: Diagnosis present

## 2024-05-20 DIAGNOSIS — Z8042 Family history of malignant neoplasm of prostate: Secondary | ICD-10-CM

## 2024-05-20 DIAGNOSIS — K219 Gastro-esophageal reflux disease without esophagitis: Secondary | ICD-10-CM | POA: Diagnosis present

## 2024-05-20 DIAGNOSIS — E119 Type 2 diabetes mellitus without complications: Secondary | ICD-10-CM | POA: Diagnosis present

## 2024-05-20 DIAGNOSIS — M5412 Radiculopathy, cervical region: Secondary | ICD-10-CM | POA: Diagnosis present

## 2024-05-20 DIAGNOSIS — Z87891 Personal history of nicotine dependence: Secondary | ICD-10-CM

## 2024-05-20 DIAGNOSIS — M4802 Spinal stenosis, cervical region: Secondary | ICD-10-CM | POA: Diagnosis present

## 2024-05-20 DIAGNOSIS — M4722 Other spondylosis with radiculopathy, cervical region: Principal | ICD-10-CM | POA: Diagnosis present

## 2024-05-20 DIAGNOSIS — Z8249 Family history of ischemic heart disease and other diseases of the circulatory system: Secondary | ICD-10-CM | POA: Diagnosis not present

## 2024-05-20 DIAGNOSIS — M50221 Other cervical disc displacement at C4-C5 level: Secondary | ICD-10-CM | POA: Diagnosis present

## 2024-05-20 DIAGNOSIS — M4712 Other spondylosis with myelopathy, cervical region: Secondary | ICD-10-CM | POA: Diagnosis present

## 2024-05-20 DIAGNOSIS — E7800 Pure hypercholesterolemia, unspecified: Secondary | ICD-10-CM | POA: Diagnosis present

## 2024-05-20 DIAGNOSIS — Z825 Family history of asthma and other chronic lower respiratory diseases: Secondary | ICD-10-CM

## 2024-05-20 DIAGNOSIS — Z981 Arthrodesis status: Principal | ICD-10-CM

## 2024-05-20 HISTORY — PX: ANTERIOR CERVICAL DECOMP/DISCECTOMY FUSION: SHX1161

## 2024-05-20 LAB — GLUCOSE, CAPILLARY
Glucose-Capillary: 134 mg/dL — ABNORMAL HIGH (ref 70–99)
Glucose-Capillary: 122 mg/dL — ABNORMAL HIGH (ref 70–99)
Glucose-Capillary: 149 mg/dL — ABNORMAL HIGH (ref 70–99)
Glucose-Capillary: 199 mg/dL — ABNORMAL HIGH (ref 70–99)

## 2024-05-20 LAB — TYPE AND SCREEN
ABO/RH(D): O POS
Antibody Screen: NEGATIVE

## 2024-05-20 SURGERY — ANTERIOR CERVICAL DECOMPRESSION/DISCECTOMY FUSION 3 LEVELS
Anesthesia: General

## 2024-05-20 MED ORDER — CHLORHEXIDINE GLUCONATE 0.12 % MT SOLN
OROMUCOSAL | Status: AC
  Administered 2024-05-20: 15 mL via OROMUCOSAL
  Filled 2024-05-20: qty 15

## 2024-05-20 MED ORDER — THROMBIN 5000 UNITS EX SOLR
OROMUCOSAL | Status: DC | PRN
  Administered 2024-05-20: 5 mL via TOPICAL

## 2024-05-20 MED ORDER — ORAL CARE MOUTH RINSE: 15.0000 mL | Freq: Once | OROMUCOSAL | Status: AC

## 2024-05-20 MED ORDER — ROCURONIUM BROMIDE 10 MG/ML (PF) SYRINGE
PREFILLED_SYRINGE | INTRAVENOUS | Status: DC | PRN
  Administered 2024-05-20: 20 mg via INTRAVENOUS
  Administered 2024-05-20: 60 mg via INTRAVENOUS
  Administered 2024-05-20: 20 mg via INTRAVENOUS

## 2024-05-20 MED ORDER — POTASSIUM CHLORIDE IN NACL 20-0.9 MEQ/L-% IV SOLN
INTRAVENOUS | Status: DC
  Filled 2024-05-20: qty 1000

## 2024-05-20 MED ORDER — INSULIN ASPART 100 UNIT/ML IJ SOLN: 0.0000 [IU] | INTRAMUSCULAR | Status: DC | PRN

## 2024-05-20 MED ORDER — ACETAMINOPHEN 500 MG PO TABS
1000.0000 mg | ORAL_TABLET | Freq: Four times a day (QID) | ORAL | Status: DC
  Administered 2024-05-20 – 2024-05-21 (×3): 1000 mg via ORAL
  Filled 2024-05-20 (×3): qty 2

## 2024-05-20 MED ORDER — CEFAZOLIN SODIUM-DEXTROSE 2-4 GM/100ML-% IV SOLN
INTRAVENOUS | Status: AC
  Filled 2024-05-20: qty 100

## 2024-05-20 MED ORDER — PROPOFOL 10 MG/ML IV BOLUS
INTRAVENOUS | Status: AC
  Filled 2024-05-20: qty 20

## 2024-05-20 MED ORDER — CEFAZOLIN SODIUM-DEXTROSE 2-4 GM/100ML-% IV SOLN
2.0000 g | Freq: Three times a day (TID) | INTRAVENOUS | Status: AC
  Administered 2024-05-20 – 2024-05-21 (×2): 2 g via INTRAVENOUS
  Filled 2024-05-20 (×2): qty 100

## 2024-05-20 MED ORDER — HYDROMORPHONE HCL 1 MG/ML IJ SOLN
INTRAMUSCULAR | Status: AC
  Filled 2024-05-20: qty 1

## 2024-05-20 MED ORDER — MORPHINE SULFATE (PF) 2 MG/ML IV SOLN: 2.0000 mg | INTRAVENOUS | Status: DC | PRN

## 2024-05-20 MED ORDER — LACTATED RINGERS IV SOLN: INTRAVENOUS | Status: DC | PRN

## 2024-05-20 MED ORDER — ONDANSETRON HCL 4 MG/2ML IJ SOLN
INTRAMUSCULAR | Status: DC | PRN
  Administered 2024-05-20: 4 mg via INTRAVENOUS

## 2024-05-20 MED ORDER — PROPOFOL 10 MG/ML IV BOLUS
INTRAVENOUS | Status: DC | PRN
  Administered 2024-05-20: 160 mg via INTRAVENOUS
  Administered 2024-05-20: 40 mg via INTRAVENOUS

## 2024-05-20 MED ORDER — 0.9 % SODIUM CHLORIDE (POUR BTL) OPTIME
TOPICAL | Status: DC | PRN
  Administered 2024-05-20: 1000 mL

## 2024-05-20 MED ORDER — OXYCODONE HCL 5 MG/5ML PO SOLN: 5.0000 mg | Freq: Once | ORAL | Status: DC | PRN

## 2024-05-20 MED ORDER — OXYCODONE HCL 5 MG PO TABS
5.0000 mg | ORAL_TABLET | ORAL | Status: DC | PRN
  Administered 2024-05-20: 5 mg via ORAL
  Filled 2024-05-20: qty 1

## 2024-05-20 MED ORDER — CHLORHEXIDINE GLUCONATE CLOTH 2 % EX PADS: 6.0000 | MEDICATED_PAD | Freq: Once | CUTANEOUS | Status: DC

## 2024-05-20 MED ORDER — HYDROMORPHONE HCL 1 MG/ML IJ SOLN
0.2500 mg | INTRAMUSCULAR | Status: DC | PRN
  Administered 2024-05-20 (×4): 0.5 mg via INTRAVENOUS

## 2024-05-20 MED ORDER — GABAPENTIN 300 MG PO CAPS: 300.0000 mg | ORAL_CAPSULE | ORAL | Status: AC

## 2024-05-20 MED ORDER — CHLORHEXIDINE GLUCONATE 0.12 % MT SOLN: 15.0000 mL | Freq: Once | OROMUCOSAL | Status: AC

## 2024-05-20 MED ORDER — DEXAMETHASONE SODIUM PHOSPHATE 10 MG/ML IJ SOLN
INTRAMUSCULAR | Status: DC | PRN
  Administered 2024-05-20: 10 mg via INTRAVENOUS

## 2024-05-20 MED ORDER — BUPIVACAINE HCL (PF) 0.25 % IJ SOLN
INTRAMUSCULAR | Status: DC | PRN
  Administered 2024-05-20: 5 mL

## 2024-05-20 MED ORDER — ONDANSETRON HCL 4 MG PO TABS: 4.0000 mg | ORAL_TABLET | Freq: Four times a day (QID) | ORAL | Status: DC | PRN

## 2024-05-20 MED ORDER — PHENOL 1.4 % MT LIQD: 1.0000 | OROMUCOSAL | Status: DC | PRN

## 2024-05-20 MED ORDER — ACETAMINOPHEN 650 MG RE SUPP: 650.0000 mg | RECTAL | Status: DC | PRN

## 2024-05-20 MED ORDER — FENTANYL CITRATE (PF) 250 MCG/5ML IJ SOLN
INTRAMUSCULAR | Status: AC
  Filled 2024-05-20: qty 5

## 2024-05-20 MED ORDER — KETAMINE HCL 50 MG/5ML IJ SOSY
PREFILLED_SYRINGE | INTRAMUSCULAR | Status: DC | PRN
  Administered 2024-05-20: 10 mg via INTRAVENOUS
  Administered 2024-05-20: 30 mg via INTRAVENOUS

## 2024-05-20 MED ORDER — KETAMINE HCL 50 MG/5ML IJ SOSY
PREFILLED_SYRINGE | INTRAMUSCULAR | Status: AC
  Filled 2024-05-20: qty 5

## 2024-05-20 MED ORDER — LIDOCAINE 2% (20 MG/ML) 5 ML SYRINGE
INTRAMUSCULAR | Status: DC | PRN
  Administered 2024-05-20: 100 mg via INTRAVENOUS

## 2024-05-20 MED ORDER — PHENYLEPHRINE 80 MCG/ML (10ML) SYRINGE FOR IV PUSH (FOR BLOOD PRESSURE SUPPORT)
PREFILLED_SYRINGE | INTRAVENOUS | Status: DC | PRN
  Administered 2024-05-20: 80 ug via INTRAVENOUS

## 2024-05-20 MED ORDER — THROMBIN 5000 UNITS EX KIT
PACK | CUTANEOUS | Status: AC
  Filled 2024-05-20: qty 1

## 2024-05-20 MED ORDER — THROMBIN 20000 UNITS EX SOLR: CUTANEOUS | Status: DC | PRN

## 2024-05-20 MED ORDER — PHENYLEPHRINE HCL-NACL 20-0.9 MG/250ML-% IV SOLN
INTRAVENOUS | Status: DC | PRN
  Administered 2024-05-20: 20 ug/min via INTRAVENOUS

## 2024-05-20 MED ORDER — BUPIVACAINE HCL (PF) 0.25 % IJ SOLN
INTRAMUSCULAR | Status: AC
  Filled 2024-05-20: qty 30

## 2024-05-20 MED ORDER — THROMBIN 20000 UNITS EX SOLR
CUTANEOUS | Status: AC
  Filled 2024-05-20: qty 20000

## 2024-05-20 MED ORDER — GABAPENTIN 300 MG PO CAPS
ORAL_CAPSULE | ORAL | Status: AC
  Administered 2024-05-20: 300 mg via ORAL
  Filled 2024-05-20: qty 1

## 2024-05-20 MED ORDER — MIDAZOLAM HCL 2 MG/2ML IJ SOLN
INTRAMUSCULAR | Status: DC | PRN
  Administered 2024-05-20: 2 mg via INTRAVENOUS

## 2024-05-20 MED ORDER — OXYCODONE HCL 5 MG PO TABS: 5.0000 mg | ORAL_TABLET | Freq: Once | ORAL | Status: DC | PRN

## 2024-05-20 MED ORDER — SODIUM CHLORIDE 0.9% FLUSH
3.0000 mL | Freq: Two times a day (BID) | INTRAVENOUS | Status: DC
  Administered 2024-05-20: 3 mL via INTRAVENOUS

## 2024-05-20 MED ORDER — MIDAZOLAM HCL 2 MG/2ML IJ SOLN
INTRAMUSCULAR | Status: AC
  Filled 2024-05-20: qty 2

## 2024-05-20 MED ORDER — LACTATED RINGERS IV SOLN: INTRAVENOUS | Status: DC

## 2024-05-20 MED ORDER — SODIUM CHLORIDE 0.9% FLUSH: 3.0000 mL | INTRAVENOUS | Status: DC | PRN

## 2024-05-20 MED ORDER — DEXAMETHASONE SODIUM PHOSPHATE 4 MG/ML IJ SOLN
4.0000 mg | Freq: Four times a day (QID) | INTRAMUSCULAR | Status: DC
  Administered 2024-05-20 – 2024-05-21 (×3): 4 mg via INTRAVENOUS
  Filled 2024-05-20 (×3): qty 1

## 2024-05-20 MED ORDER — FENTANYL CITRATE (PF) 250 MCG/5ML IJ SOLN
INTRAMUSCULAR | Status: DC | PRN
  Administered 2024-05-20: 50 ug via INTRAVENOUS
  Administered 2024-05-20: 100 ug via INTRAVENOUS
  Administered 2024-05-20 (×2): 50 ug via INTRAVENOUS

## 2024-05-20 MED ORDER — SENNA 8.6 MG PO TABS
1.0000 | ORAL_TABLET | Freq: Two times a day (BID) | ORAL | Status: DC
  Administered 2024-05-20: 8.6 mg via ORAL
  Filled 2024-05-20: qty 1

## 2024-05-20 MED ORDER — SUGAMMADEX SODIUM 200 MG/2ML IV SOLN
INTRAVENOUS | Status: DC | PRN
  Administered 2024-05-20: 200 mg via INTRAVENOUS

## 2024-05-20 MED ORDER — ACETAMINOPHEN 500 MG PO TABS: 1000.0000 mg | ORAL_TABLET | ORAL | Status: AC

## 2024-05-20 MED ORDER — SODIUM CHLORIDE 0.9 % IV SOLN
250.0000 mL | INTRAVENOUS | Status: DC
  Administered 2024-05-20: 250 mL via INTRAVENOUS

## 2024-05-20 MED ORDER — ACETAMINOPHEN 325 MG PO TABS: 650.0000 mg | ORAL_TABLET | ORAL | Status: DC | PRN

## 2024-05-20 MED ORDER — CEFAZOLIN SODIUM-DEXTROSE 2-4 GM/100ML-% IV SOLN
2.0000 g | INTRAVENOUS | Status: AC
  Administered 2024-05-20: 2 g via INTRAVENOUS

## 2024-05-20 MED ORDER — ONDANSETRON HCL 4 MG/2ML IJ SOLN
4.0000 mg | Freq: Four times a day (QID) | INTRAMUSCULAR | Status: DC | PRN
  Administered 2024-05-20 – 2024-05-21 (×2): 4 mg via INTRAVENOUS
  Filled 2024-05-20 (×2): qty 2

## 2024-05-20 MED ORDER — OXYCODONE HCL 5 MG PO TABS
ORAL_TABLET | ORAL | Status: AC
  Filled 2024-05-20: qty 1

## 2024-05-20 MED ORDER — METHOCARBAMOL 1000 MG/10ML IJ SOLN: 500.0000 mg | Freq: Four times a day (QID) | INTRAMUSCULAR | Status: DC | PRN

## 2024-05-20 MED ORDER — DEXAMETHASONE 4 MG PO TABS: 4.0000 mg | ORAL_TABLET | Freq: Four times a day (QID) | ORAL | Status: DC

## 2024-05-20 MED ORDER — AMISULPRIDE (ANTIEMETIC) 5 MG/2ML IV SOLN
10.0000 mg | Freq: Once | INTRAVENOUS | Status: DC | PRN
Start: 2024-05-20 — End: 2024-05-20

## 2024-05-20 MED ORDER — METHOCARBAMOL 500 MG PO TABS
500.0000 mg | ORAL_TABLET | Freq: Four times a day (QID) | ORAL | Status: DC | PRN
  Administered 2024-05-20: 500 mg via ORAL
  Filled 2024-05-20: qty 1

## 2024-05-20 MED ORDER — MENTHOL 3 MG MT LOZG: 1.0000 | LOZENGE | OROMUCOSAL | Status: DC | PRN

## 2024-05-20 MED ORDER — ONDANSETRON HCL 4 MG/2ML IJ SOLN: 4.0000 mg | Freq: Once | INTRAMUSCULAR | Status: DC | PRN

## 2024-05-20 MED ORDER — ACETAMINOPHEN 500 MG PO TABS
ORAL_TABLET | ORAL | Status: AC
  Administered 2024-05-20: 1000 mg via ORAL
  Filled 2024-05-20: qty 2

## 2024-05-20 SURGICAL SUPPLY — 49 items
BAG COUNTER SPONGE SURGICOUNT (BAG) ×1 IMPLANT
BAND RUBBER #18 3X1/16 STRL (MISCELLANEOUS) ×2 IMPLANT
BASKET BONE COLLECTION (BASKET) ×1 IMPLANT
BENZOIN TINCTURE PRP APPL 2/3 (GAUZE/BANDAGES/DRESSINGS) ×1 IMPLANT
BIT DRILL 2.3X12 (BIT) IMPLANT
BUR CARBIDE MATCH 3.0 (BURR) ×1 IMPLANT
CANISTER SUCTION 3000ML PPV (SUCTIONS) ×1 IMPLANT
DRAPE C-ARM 42X72 X-RAY (DRAPES) ×2 IMPLANT
DRAPE LAPAROTOMY 100X72 PEDS (DRAPES) ×1 IMPLANT
DRAPE MICROSCOPE SLANT 54X150 (MISCELLANEOUS) IMPLANT
DRSG OPSITE POSTOP 4X6 (GAUZE/BANDAGES/DRESSINGS) IMPLANT
DURAPREP 6ML APPLICATOR 50/CS (WOUND CARE) ×1 IMPLANT
ELECT COATED BLADE 2.86 ST (ELECTRODE) ×1 IMPLANT
ELECTRODE REM PT RTRN 9FT ADLT (ELECTROSURGICAL) ×1 IMPLANT
GAUZE 4X4 16PLY ~~LOC~~+RFID DBL (SPONGE) IMPLANT
GLOVE BIO SURGEON STRL SZ7 (GLOVE) ×1 IMPLANT
GLOVE BIO SURGEON STRL SZ8 (GLOVE) ×1 IMPLANT
GLOVE BIOGEL PI IND STRL 7.0 (GLOVE) ×1 IMPLANT
GLOVE BIOGEL PI IND STRL 7.5 (GLOVE) IMPLANT
GLOVE SURG SS PI 7.0 STRL IVOR (GLOVE) IMPLANT
GOWN STRL REUS W/ TWL LRG LVL3 (GOWN DISPOSABLE) ×1 IMPLANT
GOWN STRL REUS W/ TWL XL LVL3 (GOWN DISPOSABLE) ×1 IMPLANT
GOWN STRL REUS W/TWL 2XL LVL3 (GOWN DISPOSABLE) IMPLANT
HEMOSTAT POWDER KIT SURGIFOAM (HEMOSTASIS) ×1 IMPLANT
KIT BASIN OR (CUSTOM PROCEDURE TRAY) ×1 IMPLANT
KIT TURNOVER KIT B (KITS) ×1 IMPLANT
NDL HYPO 25X1 1.5 SAFETY (NEEDLE) ×1 IMPLANT
NDL SPNL 20GX3.5 QUINCKE YW (NEEDLE) ×1 IMPLANT
NEEDLE HYPO 25X1 1.5 SAFETY (NEEDLE) ×1 IMPLANT
NEEDLE SPNL 20GX3.5 QUINCKE YW (NEEDLE) ×1 IMPLANT
PACK LAMINECTOMY NEURO (CUSTOM PROCEDURE TRAY) ×1 IMPLANT
PAD ARMBOARD POSITIONER FOAM (MISCELLANEOUS) ×1 IMPLANT
PIN DISTRACTION 14MM (PIN) ×2 IMPLANT
PLATE ACP 62X3 LVL NS LF SPNE (Plate) IMPLANT
PUTTY DBM 5CC (Putty) IMPLANT
SCREW VA SINGLE LEAD 4X16 (Screw) IMPLANT
SOLN 0.9% NACL 1000 ML (IV SOLUTION) ×1 IMPLANT
SOLN 0.9% NACL POUR BTL 1000ML (IV SOLUTION) ×1 IMPLANT
SOLN STERILE WATER 1000 ML (IV SOLUTION) ×1 IMPLANT
SOLN STERILE WATER BTL 1000 ML (IV SOLUTION) ×1 IMPLANT
SPACER IDENTITI 7X16X14 7D (Spacer) IMPLANT
SPACER IDENTITI 8X16X14 7D (Spacer) IMPLANT
SPONGE INTESTINAL PEANUT (DISPOSABLE) ×1 IMPLANT
SPONGE SURGIFOAM ABS GEL 100 (HEMOSTASIS) IMPLANT
STRIP CLOSURE SKIN 1/2X4 (GAUZE/BANDAGES/DRESSINGS) ×1 IMPLANT
SUT VIC AB 3-0 SH 8-18 (SUTURE) ×1 IMPLANT
SUT VIC AB 4-0 PS2 18 (SUTURE) IMPLANT
TOWEL GREEN STERILE (TOWEL DISPOSABLE) ×1 IMPLANT
TOWEL GREEN STERILE FF (TOWEL DISPOSABLE) ×1 IMPLANT

## 2024-05-20 NOTE — Anesthesia Preprocedure Evaluation (Addendum)
 Anesthesia Evaluation  Patient identified by MRN, date of birth, ID band Patient awake    Reviewed: Allergy & Precautions, NPO status , Patient's Chart, lab work & pertinent test results  History of Anesthesia Complications Negative for: history of anesthetic complications  Airway Mallampati: II  TM Distance: >3 FB Neck ROM: Full    Dental no notable dental hx.    Pulmonary former smoker   breath sounds clear to auscultation       Cardiovascular negative cardio ROS  Rhythm:Regular Rate:Normal     Neuro/Psych  negative psych ROS   GI/Hepatic Neg liver ROS,GERD  Controlled,,  Endo/Other  diabetes, Well Controlled, Type 2  FS 134 preop A1c 5  Renal/GU negative Renal ROS  negative genitourinary   Musculoskeletal  (+) Arthritis , Osteoarthritis,    Abdominal   Peds  Hematology negative hematology ROS (+)   Anesthesia Other Findings   Reproductive/Obstetrics negative OB ROS                              Anesthesia Physical Anesthesia Plan  ASA: 2  Anesthesia Plan: General   Post-op Pain Management: Tylenol  PO (pre-op)*, Ketamine IV* and Dilaudid IV   Induction: Intravenous  PONV Risk Score and Plan: 2 and Ondansetron , Dexamethasone , Midazolam  and Treatment may vary due to age or medical condition  Airway Management Planned: Oral ETT and Video Laryngoscope Planned  Additional Equipment: None  Intra-op Plan:   Post-operative Plan: Extubation in OR  Informed Consent: I have reviewed the patients History and Physical, chart, labs and discussed the procedure including the risks, benefits and alternatives for the proposed anesthesia with the patient or authorized representative who has indicated his/her understanding and acceptance.     Dental advisory given  Plan Discussed with: CRNA  Anesthesia Plan Comments:          Anesthesia Quick Evaluation

## 2024-05-20 NOTE — H&P (Signed)
 Subjective:   Patient is a 68 y.o. male admitted for neck pain and arm pain. The patient first presented to me with complaints of neck pain, shooting pains in the arm(s), and numbness of the arm(s). Onset of symptoms was several months ago. The pain is described as aching and stabbing and occurs all day. The pain is rated severe, and is located in the neck and radiates to the arms. The symptoms have been progressive. Symptoms are exacerbated by extending head backwards, and are relieved by none.  Previous work up includes MRI of cervical spine, results: spinal stenosis.  Past Medical History:  Diagnosis Date   Allergy    seasonal allergies   AODM 03/19/2007   Arthritis    Diabetes mellitus    Per patient have not taken any meds in 3 yrs and do not check blood sugars   GERD (gastroesophageal reflux disease) 02/13/2007   HYPERCHOLESTEROLEMIA 03/19/2007   Hyperlipidemia    OBESITY 02/13/2007   Rheumatic fever    TOBACCO USE 02/13/2007   stopped smoking years ago    Past Surgical History:  Procedure Laterality Date   BACK SURGERY  1989    Allergies  Allergen Reactions   Penicillins Rash    Social History   Tobacco Use   Smoking status: Former    Types: Cigarettes   Smokeless tobacco: Never  Substance Use Topics   Alcohol use: No    Family History  Problem Relation Age of Onset   Cancer Father    COPD Sister    Heart disease Brother    Prostate cancer Brother    Prior to Admission medications   Medication Sig Start Date End Date Taking? Authorizing Provider  naproxen sodium (ALEVE) 220 MG tablet Take 220 mg by mouth daily as needed (pain).   Yes [provider]     Review of Systems  Positive ROS: neg  All other systems have been reviewed and were otherwise negative with the exception of those mentioned in the HPI and as above.  Objective: Vital signs in last 24 hours: Temp:  [98.5 F (36.9 C)] 98.5 F (36.9 C) (10/08 1106) Pulse Rate:  [62] 62 (10/08  1106) Resp:  [20] 20 (10/08 1106) BP: (135)/(79) 135/79 (10/08 1106) SpO2:  [94 %] 94 % (10/08 1106) Weight:  [88.5 kg] 88.5 kg (10/08 1106)  General Appearance: Alert, cooperative, no distress, appears stated age Head: Normocephalic, without obvious abnormality, atraumatic Eyes: PERRL, conjunctiva/corneas clear, EOM's intact      Neck: Supple, symmetrical, trachea midline, Back: Symmetric, no curvature, ROM normal, no CVA tenderness Lungs:  respirations unlabored Heart: Regular rate and rhythm Abdomen: Soft, non-tender Extremities: Extremities normal, atraumatic, no cyanosis or edema Pulses: 2+ and symmetric all extremities Skin: Skin color, texture, turgor normal, no rashes or lesions  NEUROLOGIC:  Mental status: Alert and oriented x4, no aphasia, good attention span, fund of knowledge and memory  Motor Exam - grossly normal Sensory Exam - grossly normal Reflexes: 2+ Coordination - grossly normal Gait - grossly normal Balance - grossly normal Cranial Nerves: I: smell Not tested  II: visual acuity  OS: nl    OD: nl  II: visual fields Full to confrontation  II: pupils Equal, round, reactive to light  III,VII: ptosis None  III,IV,VI: extraocular muscles  Full ROM  V: mastication Normal  V: facial light touch sensation  Normal  V,VII: corneal reflex  Present  VII: facial muscle function - upper  Normal  VII: facial muscle function -  lower Normal  VIII: hearing Not tested  IX: soft palate elevation  Normal  IX,X: gag reflex Present  XI: trapezius strength  5/5  XI: sternocleidomastoid strength 5/5  XI: neck flexion strength  5/5  XII: tongue strength  Normal    Data Review Lab Results  Component Value Date   WBC 6.4 04/28/2024   HGB 15.1 04/28/2024   HCT 44.8 04/28/2024   MCV 95.5 04/28/2024   PLT 229 04/28/2024   Lab Results  Component Value Date   NA 137 04/28/2024   K 4.5 04/28/2024   CL 107 04/28/2024   CO2 19 (L) 04/28/2024   BUN 17 04/28/2024    CREATININE 1.15 04/28/2024   GLUCOSE 139 (H) 04/28/2024   No results found for: INR, PROTIME  Assessment:   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 Estimated body mass index is 27.98 kg/m as calculated from the following:   Height as of this encounter: 5' 10 (1.778 m).   Weight as of this encounter: 88.5 kg.  Patient has failed conservative therapy. Planned surgery : ACDF with plating C3-4 C4-5 C5-6  Plan:   I explained the condition and procedure to the patient and answered any questions.  Patient wishes to proceed with procedure as planned. Understands risks/ benefits/ and expected or typical outcomes.  Alm GORMAN Molt 05/20/2024 1:02 PM

## 2024-05-20 NOTE — Plan of Care (Signed)

## 2024-05-20 NOTE — Progress Notes (Signed)
 Orthopedic Tech Progress Note Patient Details:  Barry Sims 25-Feb-1956 [id number]  Ortho Devices Type of Ortho Device: Soft collar Ortho Device/Splint Location: Delivered to bedside. Ortho Device/Splint Interventions: Ordered   Post Interventions Patient Tolerated: Well  [name] 05/20/2024, 6:33 PM

## 2024-05-20 NOTE — Op Note (Signed)
 05/20/2024  4:22 PM  PATIENT:  Barry Sims  68 y.o. 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 Decompressive anterior cervical discectomy C3-4 C4-5 C5-6, 2. Anterior cervical arthrodesis C3-4 C4-5 C5-6 utilizing a PTI interbody cage packed with locally harvested morcellized autologous bone graft with DBM putty, 3. Anterior cervical plating C3-6 inclusive utilizing a ATEC plate  SURGEON:  Alm Molt, MD  ASSISTANTS: Suzen Pean, FNP  ANESTHESIA:   General  EBL: 125 ml  Total I/O In: 1500 [I.V.:1400; IV Piggyback:100] Out: 125 [Blood:125]  BLOOD ADMINISTERED: none  DRAINS: 7 flat JP  SPECIMEN:  none  INDICATION FOR PROCEDURE: This patient presented with neck pain and left arm pain with numbness in his hands. Imaging showed cervical spinal stenosis C3-4 C4-5 C5-6 with cord compression. The patient tried conservative measures without relief. Pain was debilitating. Recommended ACDF with plating. Patient understood the risks, benefits, and alternatives and potential outcomes and wished to proceed.  PROCEDURE DETAILS: Patient was brought to the operating room placed under general endotracheal anesthesia. Patient was placed in the supine position on the operating room table. The neck was prepped with Duraprep and draped in a sterile fashion.   Three cc of local anesthesia was injected and a transverse incision was made on the right side of the neck.  Dissection was carried down thru the subcutaneous tissue and the platysma was  elevated, opened, and undermined with Metzenbaum scissors.  Dissection was then carried out thru an avascular plane leaving the sternocleidomastoid carotid artery and jugular vein laterally and the trachea and esophagus medially with the assistance of my nurse practitioner. The ventral aspect of the vertebral  column was identified and a localizing x-ray was taken. The C4-5 level was identified and all in the room agreed with the level. The longus colli muscles were then elevated and the retractor was placed with the assistance of my nurse practitioner expose C3-4 C4-5 and C5-6. The annulus was incised and the disc space entered. Discectomy was performed with micro-curettes and pituitary rongeurs. I then used the high-speed drill to drill the endplates down to the level of the posterior longitudinal ligament. The drill shavings were saved in a mucous trap for later arthrodesis. The operating microscope was draped and brought into the field provided additional magnification, illumination and visualization. Discectomy was continued posteriorly thru the disc space. Posterior longitudinal ligament was opened with a nerve hook, and then removed along with disc herniation and osteophytes, decompressing the spinal canal and thecal sac. We then continued to remove osteophytic overgrowth and disc material decompressing the neural foramina and exiting nerve roots bilaterally. The scope was angled up and down to help decompress and undercut the vertebral bodies. Once the decompression was completed we could pass a nerve hook circumferentially to assure adequate decompression in the midline and in the neural foramina. So by both visualization and palpation we felt we had an adequate decompression of the neural elements. We then measured the height of the intravertebral disc space and selected a 8 millimeter PTI interbody cage packed with autograft and DBM for each interspace. It was then gently positioned in the intravertebral disc space(s) and countersunk. I then used a 62 mm ATEC plate and placed 16 mm variable angle screws into the vertebral bodies of each level and locked them into position. The wound was irrigated with bacitracin solution, checked for hemostasis which was established and  confirmed. Once meticulous hemostasis was  achieved, we placed a 7 flat JP drain through a separate stab incision and then proceeded with closure with the assistance of my nurse practitioner. The platysma was closed with interrupted 3-0 undyed Vicryl suture, the subcuticular layer was closed with interrupted 3-0 undyed Vicryl suture. The skin edges were approximated with steristrips. The drapes were removed. A sterile dressing was applied. The patient was then awakened from general anesthesia and transferred to the recovery room in stable condition. At the end of the procedure all sponge, needle and instrument counts were correct.   PLAN OF CARE: Admit to inpatient   PATIENT DISPOSITION:  PACU - hemodynamically stable.   Delay start of Pharmacological VTE agent (>24hrs) due to surgical blood loss or risk of bleeding:  yes

## 2024-05-20 NOTE — Transfer of Care (Signed)
 Immediate Anesthesia Transfer of Care Note  Patient: Barry Sims  Procedure(s) Performed: ANTERIOR CERVICAL DECOMPRESSION/DISCECTOMY FUSION CERVICAL THREE-CERVICAL FOUR, CERVICAL FOUR-CERVICAL FIVE CERVICAL FIVE CERVICAL SIX  Patient Location: PACU  Anesthesia Type:General  Level of Consciousness: awake and alert   Airway & Oxygen Therapy: Patient Spontanous Breathing and Patient connected to face mask oxygen  Post-op Assessment: Report given to RN, Post -op Vital signs reviewed and stable, and Patient moving all extremities X 4  Post vital signs: Reviewed and stable  Last Vitals:  Vitals Value Taken Time  BP 158/92 05/20/24 16:32  Temp    Pulse 68 05/20/24 16:34  Resp 12 05/20/24 16:34  SpO2 96 % 05/20/24 16:34  Vitals shown include unfiled device data.  Last Pain:  Vitals:   05/20/24 1130  TempSrc:   PainSc: 8       Patients Stated Pain Goal: 3 (05/20/24 1124)  Complications: No notable events documented.

## 2024-05-20 NOTE — Anesthesia Postprocedure Evaluation (Signed)
 Anesthesia Post Note  Patient: Barry Sims  Procedure(s) Performed: ANTERIOR CERVICAL DECOMPRESSION/DISCECTOMY FUSION CERVICAL THREE-CERVICAL FOUR, CERVICAL FOUR-CERVICAL FIVE CERVICAL FIVE CERVICAL SIX     Patient location during evaluation: PACU Anesthesia Type: General Level of consciousness: awake and alert Pain management: pain level controlled Vital Signs Assessment: post-procedure vital signs reviewed and stable Respiratory status: spontaneous breathing, nonlabored ventilation, respiratory function stable and patient connected to nasal cannula oxygen Cardiovascular status: blood pressure returned to baseline and stable Postop Assessment: no apparent nausea or vomiting Anesthetic complications: no   No notable events documented.  Last Vitals:  Vitals:   05/20/24 1645 05/20/24 1700  BP: (!) 155/90 (!) 144/91  Pulse: 72 62  Resp: (!) 22 13  Temp:    SpO2: 96% 94%    Last Pain:  Vitals:   05/20/24 1658  TempSrc:   PainSc: 9     LLE Motor Response: Purposeful movement (05/20/24 1700) LLE Sensation: Full sensation (05/20/24 1700) RLE Motor Response: Purposeful movement (05/20/24 1700) RLE Sensation: Full sensation (05/20/24 1700)      Rome Ade

## 2024-05-20 NOTE — Anesthesia Procedure Notes (Cosign Needed Addendum)
 Procedure Name: Intubation Date/Time: 05/20/2024 1:51 PM  Performed by: Estelle Merck, Hipolito, RNPre-anesthesia Checklist: Patient identified, Emergency Drugs available, Suction available and Patient being monitored Patient Re-evaluated:Patient Re-evaluated prior to induction Oxygen Delivery Method: Circle system utilized Preoxygenation: Pre-oxygenation with 100% oxygen Induction Type: IV induction Ventilation: Oral airway inserted - appropriate to patient size and Two handed mask ventilation required Laryngoscope Size: Glidescope and 4 Grade View: Grade I Tube type: Oral Tube size: 7.5 mm Number of attempts: 1 Airway Equipment and Method: Oral airway, Rigid stylet and Video-laryngoscopy Placement Confirmation: ETT inserted through vocal cords under direct vision, positive ETCO2 and breath sounds checked- equal and bilateral Secured at: 23 cm Tube secured with: Tape Dental Injury: Teeth and Oropharynx as per pre-operative assessment  Comments: Atraumatic

## 2024-05-21 ENCOUNTER — Encounter (HOSPITAL_COMMUNITY): Payer: Self-pay | Admitting: Neurological Surgery

## 2024-05-21 LAB — GLUCOSE, CAPILLARY: Glucose-Capillary: 200 mg/dL — ABNORMAL HIGH (ref 70–99)

## 2024-05-21 MED ORDER — OXYCODONE HCL 5 MG PO TABS
10.0000 mg | ORAL_TABLET | ORAL | Status: DC | PRN
  Administered 2024-05-21: 10 mg via ORAL
  Filled 2024-05-21: qty 2

## 2024-05-21 MED ORDER — OXYCODONE HCL 5 MG PO TABS: 5.0000 mg | ORAL_TABLET | ORAL | 0 refills | Status: AC | PRN

## 2024-05-21 MED ORDER — METHOCARBAMOL 500 MG PO TABS: 500.0000 mg | ORAL_TABLET | Freq: Four times a day (QID) | ORAL | 1 refills | Status: AC | PRN

## 2024-05-21 NOTE — Discharge Summary (Signed)
 Physician Discharge Summary  Patient ID: Barry Sims MRN: [id number] DOB/AGE: [birth year] 68 y.o.  Admit date: 05/20/2024 Discharge date: 05/21/2024  Admission Diagnoses: cervical stenosis    Discharge Diagnoses: same   Discharged Condition: good  Hospital Course: The patient was admitted on 05/20/2024 and taken to the operating room where the patient underwent ACDF. The patient tolerated the procedure well and was taken to the recovery room and then to the floor in stable condition. The hospital course was routine. There were no complications. The wound remained clean dry and intact. Pt had appropriate neck soreness. No complaints of arm pain or new N/T/W. The patient remained afebrile with stable vital signs, and tolerated a regular diet. The patient continued to increase activities, and pain was well controlled with oral pain medications.   Consults: None  Significant Diagnostic Studies:  Results for orders placed or performed during the hospital encounter of 05/20/24  Glucose, capillary   Collection Time: 05/20/24 11:10 AM  Result Value Ref Range   Glucose-Capillary 134 (H) 70 - 99 mg/dL  Type and screen [facility]   Collection Time: 05/20/24 11:40 AM  Result Value Ref Range   ABO/RH(D) O POS    Antibody Screen NEG    Sample Expiration      05/23/2024,2359 Performed at [facility], [address], [place], [place] [address]   Glucose, capillary   Collection Time: 05/20/24  1:31 PM  Result Value Ref Range   Glucose-Capillary 122 (H) 70 - 99 mg/dL   Comment 1 Notify RN   Glucose, capillary   Collection Time: 05/20/24  4:34 PM  Result Value Ref Range   Glucose-Capillary 149 (H) 70 - 99 mg/dL  Glucose, capillary   Collection Time: 05/20/24  9:35 PM  Result Value Ref Range   Glucose-Capillary 199 (H) 70 - 99 mg/dL   Comment 1 Notify RN    Comment 2 Document in Chart   Glucose, capillary   Collection Time: 05/21/24  5:58 AM  Result Value  Ref Range   Glucose-Capillary 200 (H) 70 - 99 mg/dL   Comment 1 Notify RN    Comment 2 Document in Chart     DG Cervical Spine 2 or 3 views Result Date: 05/20/2024 CLINICAL DATA:  Surgery, elective.  Surgical fusion. EXAM: CERVICAL SPINE - 2-3 VIEW COMPARISON:  C-spine CT 03/31/2018 FINDINGS: Fluoroscopic images of the cervical spine were obtained. The first image demonstrates localization along the anterior superior endplate of C4. Second and third images demonstrate anterior plate and screw fixation at C3 through C6 with interbody devices. Evidence for endotracheal tube and esophageal temperature probe. IMPRESSION: Fluoroscopic images during anterior cervical fusion. Electronically Signed   By: Juliene Balder M.D.   On: 05/20/2024 16:43   DG Cervical Spine 1 View Result Date: 05/20/2024 CLINICAL DATA:  Cervical spine surgery. Evaluate for a broken instrument. EXAM: DG CERVICAL SPINE - 1 VIEW COMPARISON:  Intraoperative image dated 05/20/2024 FINDINGS: Single lateral view of the cervical spine was obtained. Anterior plate and screw fixation with interbody devices at C3 through C6. There is an endotracheal tube and another tube extending into the proximal esophagus. Probable hemostat overlying the upper cervical spine. No unexpected foreign body. IMPRESSION: 1. No unexpected foreign body. 2. Anterior plate and screw fixation at C3 through C6. 3. Findings were called to the operating room at the time of interpretation. Electronically Signed   By: Juliene Balder M.D.   On: 05/20/2024 16:39   DG  C-Arm 1-60 Min-No Report Result Date: 05/20/2024 Fluoroscopy was utilized by the requesting physician.  No radiographic interpretation.   DG C-Arm 1-60 Min-No Report Result Date: 05/20/2024 Fluoroscopy was utilized by the requesting physician.  No radiographic interpretation.   DG C-Arm 1-60 Min-No Report Result Date: 05/20/2024 Fluoroscopy was utilized by the requesting physician.  No radiographic interpretation.     Antibiotics:  Anti-infectives (From admission, onward)    Start     Dose/Rate Route Frequency Ordered Stop   05/20/24 1830  ceFAZolin  (ANCEF ) IVPB 2g/100 mL premix        2 g 200 mL/hr over 30 Minutes Intravenous Every 8 hours 05/20/24 1742 05/21/24 0304   05/20/24 1118  ceFAZolin  (ANCEF ) 2-4 GM/100ML-% IVPB       Note to Pharmacy: Joli Search N: cabinet override      05/20/24 1118 05/20/24 1407   05/20/24 1115  ceFAZolin  (ANCEF ) IVPB 2g/100 mL premix        2 g 200 mL/hr over 30 Minutes Intravenous On call to O.R. 05/20/24 1103 05/20/24 1412       Discharge Exam: Blood pressure 127/74, pulse 61, temperature 97.7 F (36.5 C), temperature source Oral, resp. rate 18, height 5' 10 (1.778 m), weight 88.5 kg, SpO2 96%. Neurologic: Grossly normal Dressing dry  Discharge Medications:   Allergies as of 05/21/2024       Reactions   Penicillins Rash   TOLERATED CEFAZOLIN         Medication List     STOP taking these medications    naproxen sodium 220 MG tablet Commonly known as: ALEVE       TAKE these medications    methocarbamol  500 MG tablet Commonly known as: ROBAXIN  Take 1 tablet (500 mg total) by mouth every 6 (six) hours as needed for muscle spasms.   oxyCODONE 5 MG immediate release tablet Commonly known as: Oxy IR/ROXICODONE Take 1 tablet (5 mg total) by mouth every 4 (four) hours as needed for moderate pain (pain score 4-6).        Disposition: home   Final Dx: ACDF C3-4 C4-5 C5-6  Discharge Instructions     Call MD for:  difficulty breathing, headache or visual disturbances   Complete by: As directed    Call MD for:  persistant nausea and vomiting   Complete by: As directed    Call MD for:  redness, tenderness, or signs of infection (pain, swelling, redness, odor or green/yellow discharge around incision site)   Complete by: As directed    Call MD for:  severe uncontrolled pain   Complete by: As directed    Call MD for:  temperature  >100.4   Complete by: As directed    Diet - low sodium heart healthy   Complete by: As directed    Increase activity slowly   Complete by: As directed    Remove dressing in 48 hours   Complete by: As directed           Signed: Alm GORMAN Molt 05/21/2024, 7:42 AM

## 2024-05-21 NOTE — Progress Notes (Signed)

## 2024-05-21 NOTE — Evaluation (Signed)
 Occupational Therapy Evaluation Patient Details Name: Barry Sims MRN: [id number] DOB: 12/04/1955 Today's Date: 05/21/2024   History of Present Illness   Barry Sims is a 68 yo male who is s/p ACDF C3-6 10/8. PMHx: arthritis, DM, GERD, hypercholesterolemia, HLD     Clinical Impressions Barry Sims was evaluated s/p the above spine surgery. He is indep, works and drives at baseline. Upon evaluation pt was limited by cervical precautions, surgical pain and decreased activity tolerance. Overall he demonstrated mod I ability to complete ADLs and mobility without DME. Provided cues and education on spinal precautions and compensatory techniques throughout, handout provided and pt demonstrated good recall during ADLs and mobility. Pt does not require further acute OT services. Recommend d/c home with support of family.       If plan is discharge home, recommend the following:   Assistance with cooking/housework;Assist for transportation     Functional Status Assessment   Patient has had a recent decline in their functional status and demonstrates the ability to make significant improvements in function in a reasonable and predictable amount of time.     Equipment Recommendations   None recommended by OT      Precautions/Restrictions   Precautions Precautions: Fall;Cervical Precaution Booklet Issued: Yes (comment) Recall of Precautions/Restrictions: Intact Required Braces or Orthoses: Cervical Brace Cervical Brace: Soft collar;At all times Restrictions Weight Bearing Restrictions Per Provider Order: No     Mobility Bed Mobility Overal bed mobility: Needs Assistance Bed Mobility: Rolling, Sidelying to Sit Rolling: Modified independent (Device/Increase time) Sidelying to sit: Modified independent (Device/Increase time)       General bed mobility comments: cues for log roll - pt plans to sleep in a recliner at discharge    Transfers Overall transfer  level: Modified independent                 General transfer comment: no AD      Balance Overall balance assessment: Modified Independent           ADL either performed or assessed with clinical judgement   ADL Overall ADL's : Modified independent           General ADL Comments: mod I for all ADLs after review of spinal precautions and compensatory techniques     Vision Baseline Vision/History: 1 Wears glasses Patient Visual Report: No change from baseline Vision Assessment?: Wears glasses for reading;Wears glasses for driving Additional Comments: dyconjugate gaze     Perception Perception: Within Functional Limits       Praxis Praxis: WFL       Pertinent Vitals/Pain Pain Assessment Pain Assessment: Faces Faces Pain Scale: Hurts little more Pain Location: neck Pain Descriptors / Indicators: Discomfort Pain Intervention(s): Limited activity within patient's tolerance, Monitored during session     Extremity/Trunk Assessment Upper Extremity Assessment Upper Extremity Assessment: Overall WFL for tasks assessed   Lower Extremity Assessment Lower Extremity Assessment: Overall WFL for tasks assessed   Cervical / Trunk Assessment Cervical / Trunk Assessment: Neck Surgery   Communication Communication Communication: No apparent difficulties   Cognition Arousal: Alert Behavior During Therapy: WFL for tasks assessed/performed Cognition: No apparent impairments           Following commands: Intact       Cueing  General Comments   Cueing Techniques: Verbal cues  VSS on RA           Home Living Family/patient expects to be discharged to:: Private residence Living Arrangements: Spouse/significant other Available Help at Discharge:  Family;Available 24 hours/day Type of Home: House Home Access: Stairs to enter [employer] of Steps: 1 small Entrance Stairs-Rails: None Home Layout: Two level;Bed/bath upstairs;Able to live on main  level with bedroom/bathroom Alternate Level Stairs-Number of Steps: flight (pt plannign on sleeping in recliner on main level)   Bathroom Shower/Tub: Producer, television/film/video: Standard     Home Equipment: None          Prior Functioning/Environment Prior Level of Function : Independent/Modified Independent;Driving;Working/employed                    OT Problem List: Decreased activity tolerance;Decreased knowledge of precautions          AM-PAC OT 6 Clicks Daily Activity     Outcome Measure Help from another person eating meals?: None Help from another person taking care of personal grooming?: None Help from another person toileting, which includes using toliet, bedpan, or urinal?: None Help from another person bathing (including washing, rinsing, drying)?: None Help from another person to put on and taking off regular upper body clothing?: None Help from another person to put on and taking off regular lower body clothing?: None 6 Click Score: 24   End of Session Equipment Utilized During Treatment: Cervical collar Nurse Communication: Mobility status  Activity Tolerance: Patient tolerated treatment well Patient left: in chair  OT Visit Diagnosis: Muscle weakness (generalized) (M62.81)                Time: 9181-9167 OT Time Calculation (min): 14 min Charges:  OT General Charges $OT Visit: 1 Visit OT Evaluation $OT Eval Moderate Complexity: 1 Mod  Lucie Kendall, OTR/L Acute Rehabilitation Services Office [phone] Secure Chat Communication Preferred   Lucie JONETTA Kendall 05/21/2024, 10:08 AM
